# Patient Record
Sex: Male | Born: 1940 | Race: White | Hispanic: No | Smoking: Former smoker
Health system: Southern US, Community
[De-identification: ages and names within clinical notes are randomized; demographics above are authoritative.]

## PROBLEM LIST (undated history)

## (undated) DIAGNOSIS — C801 Malignant (primary) neoplasm, unspecified: Secondary | ICD-10-CM

## (undated) DIAGNOSIS — E785 Hyperlipidemia, unspecified: Secondary | ICD-10-CM

## (undated) DIAGNOSIS — S329XXA Fracture of unspecified parts of lumbosacral spine and pelvis, initial encounter for closed fracture: Secondary | ICD-10-CM

## (undated) DIAGNOSIS — I10 Essential (primary) hypertension: Secondary | ICD-10-CM

## (undated) HISTORY — PX: NO PAST SURGERIES: SHX2092

---

## 2012-06-29 ENCOUNTER — Ambulatory Visit: Admission: RE | Admit: 2012-06-29 | Payer: Self-pay | Source: Ambulatory Visit | Admitting: Radiation Oncology

## 2012-06-29 ENCOUNTER — Ambulatory Visit: Payer: Self-pay

## 2013-01-14 DIAGNOSIS — C329 Malignant neoplasm of larynx, unspecified: Secondary | ICD-10-CM | POA: Diagnosis present

## 2013-06-26 DIAGNOSIS — S329XXA Fracture of unspecified parts of lumbosacral spine and pelvis, initial encounter for closed fracture: Secondary | ICD-10-CM

## 2013-06-26 HISTORY — DX: Fracture of unspecified parts of lumbosacral spine and pelvis, initial encounter for closed fracture: S32.9XXA

## 2013-07-21 ENCOUNTER — Encounter (HOSPITAL_COMMUNITY): Payer: Self-pay | Admitting: Emergency Medicine

## 2013-07-21 ENCOUNTER — Emergency Department (HOSPITAL_COMMUNITY): Payer: No Typology Code available for payment source

## 2013-07-21 ENCOUNTER — Inpatient Hospital Stay (HOSPITAL_COMMUNITY)
Admission: EM | Admit: 2013-07-21 | Discharge: 2013-07-27 | DRG: 551 | Disposition: A | Discharge status: Home / Self Care | Payer: No Typology Code available for payment source | Attending: General Surgery | Admitting: General Surgery

## 2013-07-21 DIAGNOSIS — S329XXA Fracture of unspecified parts of lumbosacral spine and pelvis, initial encounter for closed fracture: Secondary | ICD-10-CM

## 2013-07-21 DIAGNOSIS — E785 Hyperlipidemia, unspecified: Secondary | ICD-10-CM | POA: Diagnosis present

## 2013-07-21 DIAGNOSIS — S32591A Other specified fracture of right pubis, initial encounter for closed fracture: Secondary | ICD-10-CM

## 2013-07-21 DIAGNOSIS — S3210XA Unspecified fracture of sacrum, initial encounter for closed fracture: Secondary | ICD-10-CM

## 2013-07-21 DIAGNOSIS — N138 Other obstructive and reflux uropathy: Secondary | ICD-10-CM | POA: Diagnosis present

## 2013-07-21 DIAGNOSIS — Y92410 Unspecified street and highway as the place of occurrence of the external cause: Secondary | ICD-10-CM

## 2013-07-21 DIAGNOSIS — Z8521 Personal history of malignant neoplasm of larynx: Secondary | ICD-10-CM

## 2013-07-21 DIAGNOSIS — I6529 Occlusion and stenosis of unspecified carotid artery: Secondary | ICD-10-CM | POA: Diagnosis present

## 2013-07-21 DIAGNOSIS — N501 Vascular disorders of male genital organs: Secondary | ICD-10-CM | POA: Diagnosis present

## 2013-07-21 DIAGNOSIS — S322XXA Fracture of coccyx, initial encounter for closed fracture: Secondary | ICD-10-CM

## 2013-07-21 DIAGNOSIS — N400 Enlarged prostate without lower urinary tract symptoms: Secondary | ICD-10-CM | POA: Diagnosis present

## 2013-07-21 DIAGNOSIS — Z87891 Personal history of nicotine dependence: Secondary | ICD-10-CM

## 2013-07-21 DIAGNOSIS — R339 Retention of urine, unspecified: Secondary | ICD-10-CM | POA: Diagnosis present

## 2013-07-21 DIAGNOSIS — Y998 Other external cause status: Secondary | ICD-10-CM | POA: Diagnosis not present

## 2013-07-21 DIAGNOSIS — R338 Other retention of urine: Secondary | ICD-10-CM | POA: Diagnosis present

## 2013-07-21 DIAGNOSIS — D62 Acute posthemorrhagic anemia: Secondary | ICD-10-CM | POA: Diagnosis present

## 2013-07-21 DIAGNOSIS — I10 Essential (primary) hypertension: Secondary | ICD-10-CM

## 2013-07-21 DIAGNOSIS — J189 Pneumonia, unspecified organism: Secondary | ICD-10-CM | POA: Diagnosis not present

## 2013-07-21 DIAGNOSIS — S32509A Unspecified fracture of unspecified pubis, initial encounter for closed fracture: Secondary | ICD-10-CM | POA: Diagnosis present

## 2013-07-21 DIAGNOSIS — N401 Enlarged prostate with lower urinary tract symptoms: Secondary | ICD-10-CM | POA: Diagnosis present

## 2013-07-21 DIAGNOSIS — R0902 Hypoxemia: Secondary | ICD-10-CM | POA: Diagnosis not present

## 2013-07-21 DIAGNOSIS — S32599D Other specified fracture of unspecified pubis, subsequent encounter for fracture with routine healing: Secondary | ICD-10-CM

## 2013-07-21 DIAGNOSIS — I959 Hypotension, unspecified: Secondary | ICD-10-CM | POA: Diagnosis present

## 2013-07-21 HISTORY — DX: Hyperlipidemia, unspecified: E78.5

## 2013-07-21 HISTORY — DX: Essential (primary) hypertension: I10

## 2013-07-21 HISTORY — DX: Malignant (primary) neoplasm, unspecified: C80.1

## 2013-07-21 HISTORY — DX: Fracture of unspecified parts of lumbosacral spine and pelvis, initial encounter for closed fracture: S32.9XXA

## 2013-07-21 LAB — POCT I-STAT, CHEM 8
BUN: 10 mg/dL (ref 6–23)
Chloride: 100 mEq/L (ref 96–112)
Glucose, Bld: 122 mg/dL — ABNORMAL HIGH (ref 70–99)
Potassium: 4.1 mEq/L (ref 3.5–5.1)
Sodium: 135 mEq/L (ref 135–145)
TCO2: 25 mmol/L (ref 0–100)

## 2013-07-21 LAB — CBC WITH DIFFERENTIAL/PLATELET
Basophils Absolute: 0 10*3/uL (ref 0.0–0.1)
Eosinophils Relative: 0 % (ref 0–5)
Lymphocytes Relative: 8 % — ABNORMAL LOW (ref 12–46)
Neutro Abs: 15 10*3/uL — ABNORMAL HIGH (ref 1.7–7.7)
Neutrophils Relative %: 82 % — ABNORMAL HIGH (ref 43–77)
Platelets: 145 10*3/uL — ABNORMAL LOW (ref 150–400)
RDW: 13.1 % (ref 11.5–15.5)
WBC: 18.3 10*3/uL — ABNORMAL HIGH (ref 4.0–10.5)

## 2013-07-21 LAB — URINALYSIS, ROUTINE W REFLEX MICROSCOPIC
Bilirubin Urine: NEGATIVE
Glucose, UA: NEGATIVE mg/dL
Leukocytes, UA: NEGATIVE
Protein, ur: NEGATIVE mg/dL

## 2013-07-21 MED ORDER — ONDANSETRON HCL 4 MG/2ML IJ SOLN: 4.0000 mg | Freq: Four times a day (QID) | INTRAMUSCULAR | Status: DC | PRN

## 2013-07-21 MED ORDER — OXYCODONE HCL 5 MG PO TABS
10.0000 mg | ORAL_TABLET | ORAL | Status: DC | PRN
  Administered 2013-07-21 – 2013-07-22 (×2): 10 mg via ORAL
  Filled 2013-07-21 (×2): qty 2

## 2013-07-21 MED ORDER — OXYCODONE-ACETAMINOPHEN 5-325 MG PO TABS
1.0000 | ORAL_TABLET | Freq: Once | ORAL | Status: AC
  Administered 2013-07-21: 1 via ORAL
  Filled 2013-07-21: qty 1

## 2013-07-21 MED ORDER — ONDANSETRON HCL 4 MG PO TABS
4.0000 mg | ORAL_TABLET | Freq: Four times a day (QID) | ORAL | Status: DC | PRN
  Administered 2013-07-22: 4 mg via ORAL
  Filled 2013-07-21: qty 1

## 2013-07-21 MED ORDER — OXYCODONE HCL 5 MG PO TABS: 5.0000 mg | ORAL_TABLET | ORAL | Status: DC | PRN

## 2013-07-21 MED ORDER — ACETAMINOPHEN 325 MG PO TABS: 650.0000 mg | ORAL_TABLET | ORAL | Status: DC | PRN

## 2013-07-21 MED ORDER — TAMSULOSIN HCL 0.4 MG PO CAPS
0.4000 mg | ORAL_CAPSULE | Freq: Every day | ORAL | Status: DC
Start: 2013-07-21 — End: 2013-07-22
  Administered 2013-07-22: 0.4 mg via ORAL
  Filled 2013-07-21 (×2): qty 1

## 2013-07-21 MED ORDER — KCL IN DEXTROSE-NACL 20-5-0.45 MEQ/L-%-% IV SOLN
INTRAVENOUS | Status: DC
  Administered 2013-07-21: 21:00:00 via INTRAVENOUS
  Filled 2013-07-21: qty 1000

## 2013-07-21 MED ORDER — IOHEXOL 300 MG/ML  SOLN
100.0000 mL | Freq: Once | INTRAMUSCULAR | Status: AC | PRN
  Administered 2013-07-21: 100 mL via INTRAVENOUS

## 2013-07-21 MED ORDER — SODIUM CHLORIDE 0.9 % IV BOLUS (SEPSIS)
500.0000 mL | Freq: Once | INTRAVENOUS | Status: AC
  Administered 2013-07-21: 500 mL via INTRAVENOUS

## 2013-07-21 MED ORDER — PANTOPRAZOLE SODIUM 40 MG IV SOLR
40.0000 mg | Freq: Every day | INTRAVENOUS | Status: DC
  Filled 2013-07-21: qty 40

## 2013-07-21 MED ORDER — PREGABALIN 50 MG PO CAPS
75.0000 mg | ORAL_CAPSULE | Freq: Two times a day (BID) | ORAL | Status: DC
  Administered 2013-07-22 (×2): 75 mg via ORAL
  Filled 2013-07-21 (×4): qty 1

## 2013-07-21 MED ORDER — TRAMADOL HCL 50 MG PO TABS: 50.0000 mg | ORAL_TABLET | Freq: Four times a day (QID) | ORAL | Status: DC | PRN

## 2013-07-21 MED ORDER — ENOXAPARIN SODIUM 40 MG/0.4ML ~~LOC~~ SOLN
40.0000 mg | SUBCUTANEOUS | Status: DC
  Administered 2013-07-22 – 2013-07-27 (×6): 40 mg via SUBCUTANEOUS
  Filled 2013-07-21 (×6): qty 0.4

## 2013-07-21 MED ORDER — MORPHINE SULFATE 2 MG/ML IJ SOLN: 2.0000 mg | INTRAMUSCULAR | Status: DC | PRN

## 2013-07-21 MED ORDER — PANTOPRAZOLE SODIUM 40 MG PO TBEC
40.0000 mg | DELAYED_RELEASE_TABLET | Freq: Every day | ORAL | Status: DC
  Administered 2013-07-21 – 2013-07-22 (×2): 40 mg via ORAL
  Filled 2013-07-21: qty 1
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
## 2013-07-21 NOTE — Progress Notes (Signed)
ED CM spoke with Bruce Daniel from [employer] about patient's DME insurance coverage. Insurance does not cover rolling walker. In the procecess of making patient aware. Dr. Danford Bad in room to evaluate patient has decided to admit patient due to B/P changes. Pt on call for CT scan. Notified Jason. [facility] will continue to follow for discharge. CM will will continue to follow for discharge plan.

## 2013-07-21 NOTE — Progress Notes (Signed)
Pt anxious about starting home medications. Informed him of need of list to get into epic so meds may be ordered. Son brought medication list to room, home med list updated in epic. Dr. Magnus Ivan, on call for trauma, notified of list. Orders in for lyrica and flomax starting tonight and others will be addressed in AM. Pt and wife notified of orders placed, stated understanding that the rest will be addressed tomorrow.
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
## 2013-07-22 DIAGNOSIS — D62 Acute posthemorrhagic anemia: Secondary | ICD-10-CM

## 2013-07-22 DIAGNOSIS — N400 Enlarged prostate without lower urinary tract symptoms: Secondary | ICD-10-CM | POA: Diagnosis present

## 2013-07-22 DIAGNOSIS — E785 Hyperlipidemia, unspecified: Secondary | ICD-10-CM | POA: Insufficient documentation

## 2013-07-22 DIAGNOSIS — I10 Essential (primary) hypertension: Secondary | ICD-10-CM | POA: Insufficient documentation

## 2013-07-22 LAB — URINALYSIS, ROUTINE W REFLEX MICROSCOPIC
Bilirubin Urine: NEGATIVE
Nitrite: NEGATIVE
Specific Gravity, Urine: 1.021 (ref 1.005–1.030)
Urobilinogen, UA: 0.2 mg/dL (ref 0.0–1.0)
pH: 6 (ref 5.0–8.0)

## 2013-07-22 LAB — CBC
Hemoglobin: 12.7 g/dL — ABNORMAL LOW (ref 13.0–17.0)
MCH: 30.8 pg (ref 26.0–34.0)
MCHC: 35.5 g/dL (ref 30.0–36.0)
MCV: 86.9 fL (ref 78.0–100.0)
Platelets: 124 10*3/uL — ABNORMAL LOW (ref 150–400)
RBC: 4.12 MIL/uL — ABNORMAL LOW (ref 4.22–5.81)
WBC: 11.8 10*3/uL — ABNORMAL HIGH (ref 4.0–10.5)

## 2013-07-22 LAB — BASIC METABOLIC PANEL
BUN: 10 mg/dL (ref 6–23)
CO2: 24 mEq/L (ref 19–32)
Calcium: 8.3 mg/dL — ABNORMAL LOW (ref 8.4–10.5)
Chloride: 99 mEq/L (ref 96–112)
Creatinine, Ser: 1.18 mg/dL (ref 0.50–1.35)
GFR calc Af Amer: 69 mL/min — ABNORMAL LOW (ref >90)
Glucose, Bld: 124 mg/dL — ABNORMAL HIGH (ref 70–99)

## 2013-07-22 MED ORDER — BETHANECHOL CHLORIDE 25 MG PO TABS
25.0000 mg | ORAL_TABLET | Freq: Four times a day (QID) | ORAL | Status: DC
  Administered 2013-07-22 – 2013-07-25 (×12): 25 mg via ORAL
  Filled 2013-07-22 (×16): qty 1

## 2013-07-22 MED ORDER — NAPROXEN 500 MG PO TABS
500.0000 mg | ORAL_TABLET | Freq: Two times a day (BID) | ORAL | Status: DC
  Administered 2013-07-22 – 2013-07-27 (×10): 500 mg via ORAL
  Filled 2013-07-22 (×13): qty 1

## 2013-07-22 MED ORDER — TRAMADOL HCL 50 MG PO TABS
100.0000 mg | ORAL_TABLET | Freq: Four times a day (QID) | ORAL | Status: DC
  Administered 2013-07-22 (×3): 100 mg via ORAL
  Filled 2013-07-22 (×4): qty 2

## 2013-07-22 MED ORDER — OXYCODONE HCL 5 MG PO TABS
5.0000 mg | ORAL_TABLET | ORAL | Status: DC | PRN
  Filled 2013-07-22: qty 2

## 2013-07-22 MED ORDER — ASPIRIN EC 81 MG PO TBEC
81.0000 mg | DELAYED_RELEASE_TABLET | Freq: Every day | ORAL | Status: DC
  Administered 2013-07-22 – 2013-07-27 (×6): 81 mg via ORAL
  Filled 2013-07-22 (×6): qty 1

## 2013-07-22 MED ORDER — LISINOPRIL 10 MG PO TABS
10.0000 mg | ORAL_TABLET | Freq: Two times a day (BID) | ORAL | Status: DC
  Administered 2013-07-22 – 2013-07-23 (×3): 10 mg via ORAL
  Filled 2013-07-22 (×8): qty 1

## 2013-07-22 MED ORDER — POLYETHYLENE GLYCOL 3350 17 G PO PACK
17.0000 g | PACK | Freq: Every day | ORAL | Status: DC
  Administered 2013-07-22 – 2013-07-27 (×4): 17 g via ORAL
  Filled 2013-07-22 (×6): qty 1

## 2013-07-22 MED ORDER — ATORVASTATIN CALCIUM 40 MG PO TABS
40.0000 mg | ORAL_TABLET | Freq: Every day | ORAL | Status: DC
  Administered 2013-07-22 – 2013-07-26 (×5): 40 mg via ORAL
  Filled 2013-07-22 (×6): qty 1

## 2013-07-22 MED ORDER — TAMSULOSIN HCL 0.4 MG PO CAPS
0.8000 mg | ORAL_CAPSULE | Freq: Every day | ORAL | Status: DC
  Administered 2013-07-22 – 2013-07-26 (×5): 0.8 mg via ORAL
  Filled 2013-07-22 (×7): qty 2

## 2013-07-22 MED ORDER — MORPHINE SULFATE 2 MG/ML IJ SOLN: 2.0000 mg | INTRAMUSCULAR | Status: DC | PRN

## 2013-07-22 MED ORDER — PREGABALIN 50 MG PO CAPS
75.0000 mg | ORAL_CAPSULE | Freq: Two times a day (BID) | ORAL | Status: DC
  Administered 2013-07-22 – 2013-07-27 (×6): 75 mg via ORAL
  Filled 2013-07-22 (×15): qty 1

## 2013-07-22 MED ORDER — DOCUSATE SODIUM 100 MG PO CAPS
100.0000 mg | ORAL_CAPSULE | Freq: Two times a day (BID) | ORAL | Status: DC
  Administered 2013-07-22 – 2013-07-27 (×11): 100 mg via ORAL
  Filled 2013-07-22 (×11): qty 1

## 2013-07-22 MED ORDER — MELATONIN 5 MG PO TABS: 5.0000 mg | ORAL_TABLET | Freq: Every day | ORAL | Status: DC
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
## 2013-07-23 LAB — CBC
HCT: 32.9 % — ABNORMAL LOW (ref 39.0–52.0)
Hemoglobin: 11.7 g/dL — ABNORMAL LOW (ref 13.0–17.0)
MCH: 31.1 pg (ref 26.0–34.0)
MCV: 87.5 fL (ref 78.0–100.0)
Platelets: 105 10*3/uL — ABNORMAL LOW (ref 150–400)
RBC: 3.76 MIL/uL — ABNORMAL LOW (ref 4.22–5.81)
WBC: 14.6 10*3/uL — ABNORMAL HIGH (ref 4.0–10.5)

## 2013-07-23 LAB — URINE CULTURE
Colony Count: NO GROWTH
Culture: NO GROWTH
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
## 2013-07-24 ENCOUNTER — Inpatient Hospital Stay (HOSPITAL_COMMUNITY): Payer: No Typology Code available for payment source

## 2013-07-24 DIAGNOSIS — R0902 Hypoxemia: Secondary | ICD-10-CM | POA: Diagnosis not present

## 2013-07-24 LAB — CBC
Hemoglobin: 10.2 g/dL — ABNORMAL LOW (ref 13.0–17.0)
MCH: 30.6 pg (ref 26.0–34.0)
MCV: 85.9 fL (ref 78.0–100.0)
RBC: 3.33 MIL/uL — ABNORMAL LOW (ref 4.22–5.81)
RDW: 13.1 % (ref 11.5–15.5)

## 2013-07-24 LAB — EXPECTORATED SPUTUM ASSESSMENT W GRAM STAIN, RFLX TO RESP C: Special Requests: NORMAL

## 2013-07-24 MED ORDER — TRAMADOL HCL 50 MG PO TABS: 50.0000 mg | ORAL_TABLET | Freq: Four times a day (QID) | ORAL | Status: DC | PRN

## 2013-07-24 MED ORDER — ALBUTEROL SULFATE (5 MG/ML) 0.5% IN NEBU
2.5000 mg | INHALATION_SOLUTION | Freq: Four times a day (QID) | RESPIRATORY_TRACT | Status: DC
  Administered 2013-07-24 – 2013-07-25 (×4): 2.5 mg via RESPIRATORY_TRACT
  Filled 2013-07-24 (×5): qty 0.5

## 2013-07-24 MED ORDER — DEXTROSE 5 % IV SOLN
1.0000 g | Freq: Two times a day (BID) | INTRAVENOUS | Status: DC
  Administered 2013-07-24 – 2013-07-25 (×3): 1 g via INTRAVENOUS
  Filled 2013-07-24 (×4): qty 1

## 2013-07-24 MED ORDER — IPRATROPIUM BROMIDE 0.02 % IN SOLN
0.5000 mg | Freq: Four times a day (QID) | RESPIRATORY_TRACT | Status: DC
  Administered 2013-07-24 – 2013-07-25 (×4): 0.5 mg via RESPIRATORY_TRACT
  Filled 2013-07-24 (×5): qty 2.5

## 2013-07-24 NOTE — Progress Notes (Signed)
Okay for therapy now that his sats are 98% on 4L.  Patient is confused.  Perhaps medication related.  Not hypoxemic.  I do not think that he has a pneumonia, but will keep on the antibiotics empirically until sputum culture is back.  This patient has been seen and I agree with the findings and treatment plan.  Marta Lamas. Gae Bon, MD, FACS [phone] (pager) [phone] (direct pager) Trauma Surgeon
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
## 2013-07-24 NOTE — Significant Event (Signed)
Rapid Response Event Note Lethargy & decreased sats Overview: Time Called: 0600 Arrival Time: 0603 Event Type: Respiratory  Initial Focused Assessment: On assessment pt is rhonchus with shallow breaths & weak, wet-sounding, nonproductive cough.  Sats 93% on 40%VM  Interventions: Stat PCXR Flutter Valve IS instruction  Event Summary: Name of Physician Notified: B. Janee Morn, MD at [phone]    at    Outcome: Stayed in room and stabalized     Bruce Daniel

## 2013-07-24 NOTE — Progress Notes (Signed)
PHARMACIST - PHYSICIAN ORDER COMMUNICATION  CONCERNING: P&T Medication Policy on Herbal Medications  DESCRIPTION:  This patient's order for:  melatonin  has been noted.  This product(s) is classified as an "herbal" or natural product. Due to a lack of definitive safety studies or FDA approval, nonstandard manufacturing practices, plus the potential risk of unknown drug-drug interactions while on inpatient medications, the Pharmacy and Therapeutics Committee does not permit the use of "herbal" or natural products of this type within [place].   ACTION TAKEN: The pharmacy department is unable to verify this order at this time. Please reevaluate patient's clinical condition at discharge and address if the herbal or natural product(s) should be resumed at that time.   

## 2013-07-24 NOTE — Clinical Social Work Placement (Addendum)
Clinical Social Work Department CLINICAL SOCIAL WORK PLACEMENT NOTE 07/24/2013  Patient:  Bruce Daniel, Bruce Daniel  Account Number:  [id number] Admit date:  07/21/2013  Clinical Social Worker:  Macario Golds, LCSW  Date/time:  07/24/2013 03:30 PM  Clinical Social Work is seeking post-discharge placement for this patient at the following level of care:   SKILLED NURSING   (*CSW will update this form in Epic as items are completed)   07/24/2013  Patient/family provided with [facility] Department of Clinical Social Work's list of facilities offering this level of care within the geographic area requested by the patient (or if unable, by the patient's family).  07/24/2013  Patient/family informed of their freedom to choose among providers that offer the needed level of care, that participate in Medicare, Medicaid or managed care program needed by the patient, have an available bed and are willing to accept the patient.  07/24/2013  Patient/family informed of [facility]' ownership interest in [facility], as well as of the fact that they are under no obligation to receive care at this facility.  PASARR submitted to EDS on 07/24/2013 PASARR number received from EDS on 07/24/2013  FL2 transmitted to all facilities in geographic area requested by pt/family on  07/24/2013 FL2 transmitted to all facilities within larger geographic area on   Patient informed that his/her managed care company has contracts with or will negotiate with  certain facilities, including the following:     Patient/family informed of bed offers received:  07/26/2013 Patient chooses bed at  [facility] Physician recommends and patient chooses bed at    Patient to be transferred to [facility] on  07/27/2013   Patient to be transferred to facility by   Ambulance  The following physician request were entered in Epic:   Additional Comments: 09/29 Patient girlfriend has preference to [facility] and [employer]

## 2013-07-24 NOTE — Progress Notes (Signed)
72yo male w/ lethargy and decreased sats w/ weak, wet-sounding cough. Will begin cefepime 1g IV Q12H and monitor CBC, Cx, and clinical progression.  Vernard Gambles, PharmD, BCPS 07/24/2013 7:49 AM

## 2013-07-24 NOTE — Progress Notes (Signed)
RRN paged at 6 am due pt respiratory status.  Pt's O2 sat was 71% on RA and lethargic.  Place pt on continuous pulse ox and venti mask at 50%, O2 sat went up to low 90's.  Has productive congestive cough yellowish mucus .  Encourage IS but is only able to reach up to 250.  MD on call paged. Orders received.  Will cont monitor.

## 2013-07-24 NOTE — Progress Notes (Addendum)
PT Cancellation Note  Patient Details Name: Bruce Daniel MRN: [id number] DOB: 28-Oct-1940   Cancelled Treatment:    Reason Eval/Treat Not Completed: Patient not medically ready;Medical issues which prohibited therapy. Pt with low O2 stats. Pt is being considered for SDU per last PA note. Will hold off on pt till medically stable.    Lafe Garin, OT  07/24/2013, 10:52 AM

## 2013-07-24 NOTE — Progress Notes (Signed)
PT Cancellation Note  Patient Details Name: Bruce Daniel MRN: [id number] DOB: Apr 28, 1941   Cancelled Treatment:    Reason Eval/Treat Not Completed: Patient not medically ready;Medical issues which prohibited therapy. Pt with low O2 stats. Being considered for SDU. Will hold off on pt till medically stable.   Donnamarie Poag [place], [place] [phone] 07/24/2013, 10:53 AM
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
## 2013-07-24 NOTE — Progress Notes (Signed)
Subjective:     Patient reports pain as mild.  Mostly in the left posterior thigh Pain minimal at rest.  Objective: Vital signs in last 24 hours: Temp:  [97.6 F (36.4 C)-98.4 F (36.9 C)] 98.4 F (36.9 C) (09/29 0537) Pulse Rate:  [63-90] 78 (09/29 1047) Resp:  [17-20] 20 (09/29 1047) BP: (106-116)/(47-55) 106/51 mmHg (09/29 1047) SpO2:  [91 %-97 %] 95 % (09/29 1048) FiO2 (%):  [40 %-50 %] 40 % (09/29 1047)  Intake/Output from previous day: 09/28 0701 - 09/29 0700 In: 240 [P.O.:240] Out: 425 [Urine:425] Intake/Output this shift:     Recent Labs  07/21/13 1502 07/21/13 1531 07/22/13 0355 07/23/13 0620 07/24/13 0830  HGB 14.8 15.0 12.7* 11.7* 10.2*    Recent Labs  07/23/13 0620 07/24/13 0830  WBC 14.6* 13.3*  RBC 3.76* 3.33*  HCT 32.9* 28.6*  PLT 105* 93*    Recent Labs  07/21/13 1531 07/22/13 0355  NA 135 134*  K 4.1 4.2  CL 100 99  CO2  --  24  BUN 10 10  CREATININE 1.20 1.18  GLUCOSE 122* 124*  CALCIUM  --  8.3*   No results found for this basename: LABPT, INR,  in the last 72 hours  Neurovascular intact Sensation intact distally Dorsiflexion/Plantar flexion intact  Assessment/Plan:     Up with therapy, WBAT  Aylee Littrell M 07/24/2013, 11:37 AM
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
## 2013-07-25 DIAGNOSIS — J189 Pneumonia, unspecified organism: Secondary | ICD-10-CM

## 2013-07-25 DIAGNOSIS — R338 Other retention of urine: Secondary | ICD-10-CM | POA: Diagnosis present

## 2013-07-25 DIAGNOSIS — R339 Retention of urine, unspecified: Secondary | ICD-10-CM

## 2013-07-25 LAB — CBC
Hemoglobin: 9.7 g/dL — ABNORMAL LOW (ref 13.0–17.0)
MCH: 30.6 pg (ref 26.0–34.0)
MCV: 83.6 fL (ref 78.0–100.0)
Platelets: 96 10*3/uL — ABNORMAL LOW (ref 150–400)
RBC: 3.17 MIL/uL — ABNORMAL LOW (ref 4.22–5.81)
RDW: 12.9 % (ref 11.5–15.5)
WBC: 9.4 10*3/uL (ref 4.0–10.5)

## 2013-07-25 MED ORDER — LISINOPRIL 5 MG PO TABS
5.0000 mg | ORAL_TABLET | Freq: Two times a day (BID) | ORAL | Status: DC
  Administered 2013-07-25 – 2013-07-26 (×4): 5 mg via ORAL
  Filled 2013-07-25 (×6): qty 1

## 2013-07-25 MED ORDER — ALBUTEROL SULFATE (5 MG/ML) 0.5% IN NEBU
2.5000 mg | INHALATION_SOLUTION | Freq: Two times a day (BID) | RESPIRATORY_TRACT | Status: DC
  Administered 2013-07-25 – 2013-07-27 (×2): 2.5 mg via RESPIRATORY_TRACT
  Filled 2013-07-25 (×4): qty 0.5

## 2013-07-25 MED ORDER — LEVOFLOXACIN 500 MG PO TABS
500.0000 mg | ORAL_TABLET | Freq: Every day | ORAL | Status: DC
  Administered 2013-07-25 – 2013-07-27 (×3): 500 mg via ORAL
  Filled 2013-07-25 (×4): qty 1

## 2013-07-25 MED ORDER — IPRATROPIUM BROMIDE 0.02 % IN SOLN
0.5000 mg | Freq: Two times a day (BID) | RESPIRATORY_TRACT | Status: DC
  Administered 2013-07-25 – 2013-07-27 (×2): 0.5 mg via RESPIRATORY_TRACT
  Filled 2013-07-25 (×4): qty 2.5
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
## 2013-07-25 NOTE — Progress Notes (Signed)
Pt's foley was DC at 07/24/13 at 1720. Pt tried multiple unsuccessful times to void. MD on call paged. Foley inserted per order. Pt tolerated well, 400 ml clear urine drained , will monitor.
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
## 2013-07-25 NOTE — Progress Notes (Signed)
Concerned about urinary retention. Plan for urology consult. Continue therapies. I also spoke to his wife. Patient examined and I agree with the assessment and plan  Violeta Gelinas, MD, MPH, FACS Pager: [phone]  07/25/2013 9:58 AM

## 2013-07-26 ENCOUNTER — Encounter (HOSPITAL_COMMUNITY): Payer: Self-pay | Admitting: General Practice

## 2013-07-26 LAB — CBC
HCT: 27.8 % — ABNORMAL LOW (ref 39.0–52.0)
Hemoglobin: 10.1 g/dL — ABNORMAL LOW (ref 13.0–17.0)
RBC: 3.27 MIL/uL — ABNORMAL LOW (ref 4.22–5.81)
RDW: 13.3 % (ref 11.5–15.5)
WBC: 10.1 10*3/uL (ref 4.0–10.5)

## 2013-07-26 LAB — CULTURE, RESPIRATORY W GRAM STAIN

## 2013-07-26 LAB — CULTURE, RESPIRATORY

## 2013-07-26 MED ORDER — ENSURE COMPLETE PO LIQD
237.0000 mL | Freq: Two times a day (BID) | ORAL | Status: DC
  Administered 2013-07-26 (×2): 237 mL via ORAL
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
## 2013-07-26 NOTE — Progress Notes (Signed)
Subjective:     Patient reports pain as mild.   Progressing slowly with PT.  Pain controlled.  Objective: Vital signs in last 24 hours: Temp:  [97.6 F (36.4 C)-98.5 F (36.9 C)] 97.6 F (36.4 C) (10/01 1300) Pulse Rate:  [85-102] 102 (10/01 1300) Resp:  [16-18] 17 (10/01 1300) BP: (120-133)/(50-56) 120/50 mmHg (10/01 1300) SpO2:  [93 %-96 %] 96 % (10/01 1300)  Intake/Output from previous day: 09/30 0701 - 10/01 0700 In: 1400 [P.O.:1400] Out: 3900 [Urine:3900] Intake/Output this shift: Total I/O In: 560 [P.O.:560] Out: 450 [Urine:450]   Recent Labs  07/24/13 0830 07/25/13 0550 07/26/13 0540  HGB 10.2* 9.7* 10.1*    Recent Labs  07/25/13 0550 07/26/13 0540  WBC 9.4 10.1  RBC 3.17* 3.27*  HCT 26.5* 27.8*  PLT 96* 139*   No results found for this basename: NA, K, CL, CO2, BUN, CREATININE, GLUCOSE, CALCIUM,  in the last 72 hours No results found for this basename: LABPT, INR,  in the last 72 hours  Neurovascular intact Sensation intact distally Dorsiflexion/Plantar flexion intact  Assessment/Plan:     Discharge to SNF.  Stable from ortho standpoint for discharge when bed available.  Bruce Daniel M 07/26/2013, 4:01 PM

## 2013-07-26 NOTE — Progress Notes (Signed)
UR completed.  Tanda Morrissey, RN BSN MHA CCM Trauma/Neuro ICU Case Manager [phone]  
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
## 2013-07-26 NOTE — Progress Notes (Signed)
Patient awaiting placement ans wants [facility].  Will check with Child psychotherapist.  This patient has been seen and I agree with the findings and treatment plan.  Marta Lamas. Gae Bon, MD, FACS [phone] (pager) [phone] (direct pager) Trauma Surgeon

## 2013-07-27 ENCOUNTER — Non-Acute Institutional Stay (SKILLED_NURSING_FACILITY): Payer: Medicare Other | Admitting: Internal Medicine

## 2013-07-27 ENCOUNTER — Encounter: Payer: Self-pay | Admitting: Internal Medicine

## 2013-07-27 DIAGNOSIS — D62 Acute posthemorrhagic anemia: Secondary | ICD-10-CM

## 2013-07-27 DIAGNOSIS — R338 Other retention of urine: Secondary | ICD-10-CM

## 2013-07-27 DIAGNOSIS — R0902 Hypoxemia: Secondary | ICD-10-CM

## 2013-07-27 DIAGNOSIS — S3210XD Unspecified fracture of sacrum, subsequent encounter for fracture with routine healing: Secondary | ICD-10-CM

## 2013-07-27 DIAGNOSIS — E785 Hyperlipidemia, unspecified: Secondary | ICD-10-CM

## 2013-07-27 DIAGNOSIS — IMO0001 Reserved for inherently not codable concepts without codable children: Secondary | ICD-10-CM

## 2013-07-27 DIAGNOSIS — S32591D Other specified fracture of right pubis, subsequent encounter for fracture with routine healing: Secondary | ICD-10-CM

## 2013-07-27 DIAGNOSIS — I10 Essential (primary) hypertension: Secondary | ICD-10-CM

## 2013-07-27 MED ORDER — LEVOFLOXACIN 500 MG PO TABS: 500.0000 mg | ORAL_TABLET | Freq: Every day | ORAL | Status: AC

## 2013-07-27 MED ORDER — ENSURE COMPLETE PO LIQD: 237.0000 mL | Freq: Two times a day (BID) | ORAL | Status: DC

## 2013-07-27 MED ORDER — IPRATROPIUM BROMIDE 0.02 % IN SOLN: 0.5000 mg | Freq: Two times a day (BID) | RESPIRATORY_TRACT | Status: DC

## 2013-07-27 MED ORDER — POLYETHYLENE GLYCOL 3350 17 G PO PACK: 17.0000 g | PACK | Freq: Every day | ORAL | Status: DC

## 2013-07-27 MED ORDER — TRAMADOL HCL 50 MG PO TABS: 50.0000 mg | ORAL_TABLET | Freq: Four times a day (QID) | ORAL | Status: DC | PRN

## 2013-07-27 MED ORDER — NAPROXEN 500 MG PO TABS: 500.0000 mg | ORAL_TABLET | Freq: Two times a day (BID) | ORAL | Status: AC

## 2013-07-27 MED ORDER — ALBUTEROL SULFATE (5 MG/ML) 0.5% IN NEBU: 2.5000 mg | INHALATION_SOLUTION | Freq: Two times a day (BID) | RESPIRATORY_TRACT | Status: DC
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
## 2013-07-27 NOTE — Assessment & Plan Note (Signed)
Continue Lipitor 40 mg
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
## 2013-07-27 NOTE — Assessment & Plan Note (Signed)
On lisinopril 10 mg BID
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
## 2013-07-27 NOTE — Progress Notes (Signed)
D/C foley order -patient refuses. Risk of prolonged foley use explained to patient and wife. Continue to follow at SNF per MD V.O. if patient refuses.
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
## 2013-07-27 NOTE — Progress Notes (Signed)
Agree with updated frequency per PTA. [place], [place], [place] [phone]

## 2013-07-27 NOTE — Assessment & Plan Note (Signed)
Did not require blood tx;will recheck on Monday 10/6

## 2013-07-27 NOTE — Progress Notes (Signed)
Patient now requests foley DC prior to transport to SNF . Requests condom cath when voids at SNF -reported to receiving RN.
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
## 2013-07-27 NOTE — Clinical Social Work Note (Signed)
Clinical Social Worker facilitated patient discharge including contacting patient family and facility to confirm patient discharge plans.  Clinical information faxed to facility and family agreeable with plan.  CSW arranged ambulance transport via [facility] to [facility].  RN to call report prior to discharge.  Clinical Social Worker will sign off for now as social work intervention is no longer needed. Please consult us again if new need arises.  Jesse Johnni Wunschel, LCSW [phone] 

## 2013-07-27 NOTE — Discharge Summary (Signed)
Bruce Barefield, MD, MPH, FACS Pager: [phone]  

## 2013-07-27 NOTE — Progress Notes (Signed)
Report called to receiving RN.

## 2013-07-27 NOTE — Progress Notes (Signed)
Will D/C foley. Awaiting SNF placement. Patient examined and I agree with the assessment and plan  Violeta Gelinas, MD, MPH, FACS Pager: [phone]  07/27/2013 10:18 AM
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
## 2013-08-22 ENCOUNTER — Encounter: Payer: Self-pay | Admitting: Internal Medicine

## 2013-08-22 ENCOUNTER — Non-Acute Institutional Stay (SKILLED_NURSING_FACILITY): Payer: Medicare Other | Admitting: Internal Medicine

## 2013-08-22 DIAGNOSIS — S3210XD Unspecified fracture of sacrum, subsequent encounter for fracture with routine healing: Secondary | ICD-10-CM

## 2013-08-22 DIAGNOSIS — N400 Enlarged prostate without lower urinary tract symptoms: Secondary | ICD-10-CM

## 2013-08-22 DIAGNOSIS — E785 Hyperlipidemia, unspecified: Secondary | ICD-10-CM

## 2013-08-22 DIAGNOSIS — IMO0001 Reserved for inherently not codable concepts without codable children: Secondary | ICD-10-CM

## 2013-08-22 DIAGNOSIS — S32599D Other specified fracture of unspecified pubis, subsequent encounter for fracture with routine healing: Secondary | ICD-10-CM

## 2013-08-22 DIAGNOSIS — S72009D Fracture of unspecified part of neck of unspecified femur, subsequent encounter for closed fracture with routine healing: Secondary | ICD-10-CM
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
## 2013-08-26 DIAGNOSIS — I10 Essential (primary) hypertension: Secondary | ICD-10-CM

## 2013-08-26 DIAGNOSIS — IMO0001 Reserved for inherently not codable concepts without codable children: Secondary | ICD-10-CM

## 2013-08-26 DIAGNOSIS — IMO0002 Reserved for concepts with insufficient information to code with codable children: Secondary | ICD-10-CM
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
## 2019-06-14 ENCOUNTER — Other Ambulatory Visit: Payer: Self-pay

## 2019-06-14 DIAGNOSIS — Z20822 Contact with and (suspected) exposure to covid-19: Secondary | ICD-10-CM

## 2019-06-15 LAB — NOVEL CORONAVIRUS, NAA: SARS-CoV-2, NAA: NOT DETECTED

## 2024-03-15 DIAGNOSIS — I350 Nonrheumatic aortic (valve) stenosis: Secondary | ICD-10-CM | POA: Insufficient documentation

## 2024-08-30 NOTE — ED Provider Notes (Signed)
------------------------------------------------------------------------------- °  Attestation signed by Greig Loa Buttery, DO at 09/06/2024 12:57 AM ________________________________________________________________________ Shared service with APP.  I have personally seen and examined the patient, providing direct face to face care. I performed a substantive portion of the medical decision making for the patient encounter, as documented by the APP.    My interpretation of this patient's presentation was consistent with the APP. Patient presented due to concerns for social concerns due to inability to care for self/unsafe living conditions. Pt was determined to not have capacity and was unsafe returning home alone.  Patient's presentation is most consistent with exacerbation of chronic illness.    Amy Diane Lowther, DO  -------------------------------------------------------------------------------  Medical Decision Making: Care of patient assumed from Dr. Ethridge at 0700.  Agree with history and physical exam as documented - please see their note for details. Briefly, this is a  83 y.o. male with PMHx of undergoing laryngeal cx radiation treatments(stage II HP SqCC), s/p gtube placement 08/17/24 d/t severe odynophagia, prior history of Sqcc of the glottis s/p RT 63 Gy 28 fx (2013), severe aortic stenosis, CAD, HTN, BPH who currently resides at [facility]) s/p his gtube placement 08/17/24 d/t severe odynophagia. He was sent to the ED from his oncologist because he signed out AMA from his SNF and there are concerns about his capacity and his ability to care for himself. He is a social hold pending safe disposition.  Had extensive conversation with the patient. Unfortunately, I do not believe that he has capacity to make his own medical decisions. He did not demonstrate understanding of his medical problems. He told me that he was not aware that he has cancer. When I asked him what the  purpose of his radiation is for he said for my activity. When we discussed his difficulty with using his feeding tube that was documented by the nutritionist consult he told me I don't remember that and then told me that he dose not need a feeding tube because he can eat just fine. When I discussed his trouble swallowing due to his cancer he responded, well my throat is sore, I will give you that. Patient was not able to get out of bed without significant assistance from myself and a medical student and could not ambulate on his own. Social work has spoken with family and they feel that patient is best served in a facility and is not properly caring for himself or safe at home alone. Patient cannot explain to me what the forseeable consequences of accepting or refusing proposed treatments would be therefore my overall impression is that this patient does not have capacity. I discussed my concerns with the patient and that although I understand that patient would like to go back home by himself, we will need to send him back to his SNF. Family was updated by social work and is agreeable with this plan. Patient discharged to SNF with stable vital signs and with 4 days of feeding tube supplies to last until delivery of more supplies can be made to SNF.  Clinical Impression: 1. Unable to function independently Active  2. Laryngeal cancer [facility])     Disposition: ED Disposition     ED Disposition  Discharge   Condition  Stable   Comment  08/30/2024

## 2024-10-02 NOTE — Progress Notes (Signed)
 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 Lauralee Medical Oncologist: Dr. Achilles Radiation Oncologist: Dr. Vinita, previously Dr. Cheree   Laryngeal cancer    (CMD)  07/11/2012 - 08/17/2012 Radiation   63 Gy in 28 fractions to larynx   07/18/2024 - 09/07/2024 Radiation   IMRT: Head and neck   Treatment Period Technique Fraction Dose Fractions Total Dose  Course 2 07/18/2024-09/07/2024  (days elapsed: 51)        Hypopharynx retx 07/18/2024-09/07/2024 VMAT 200 / 200 cGy 33 / 33 6,600 / 6,600 cGy     Squamous cell cancer of hypopharynx    (CMD)  12/14/2023 Imaging   CT Neck - Nonspecific mild soft tissue thickening in the inferior left piriform sinus and left post-cricoid region, without a discrete mass    11/2023 Recurrence   3-4 months of sore throat and L otalgia, 10-15 lb weight loss CT neck: nonspecific mild soft tissue thickening L piriform sinus, L post-cricoid    01/2024 Procedure   FFL at [facility]: ulcerative mass left/midline postcricoid 2-3 cm; pyriform sinuses appear clear    02/28/2024 Imaging   PET/CT: ill-defined soft tissue thickening with focal FDG uptake in the left piriform sinus; nonspecific FDG uptake at the L BOT and L NP, pharyngeal recess uptake; small right level 2B lymph node with mild FDG uptake is favored reactive    03/01/2024 -  Other   FFL: vocal folds were mildly edematous but smooth and without mucosal lesion. Vocal fold mobility was intact and symmetric. There is a vague area of mucosal abnormality  in the left postcricoid area, not obvious    06/09/2024 Biopsy   Left HP: squamous cell carcinoma, moderately differentiated DL findings: firm ulcerative lesion along the L HP/postcricoid region   07/18/2024 - 09/07/2024 Radiation   IMRT: Head and neck   Treatment Period  Technique Fraction Dose Fractions Total Dose  Course 2 07/18/2024-09/07/2024  (days elapsed: 51)        Hypopharynx retx 07/18/2024-09/07/2024 VMAT 200 / 200 cGy 33 / 33 6,600 / 6,600 cGy       Interval: 1 month since last visit, 1 month since completion of therapy  Updated PMH:  Medical History[1] No in-person visits or hospital admissions since his final fraction of radiation therapy No ED visits or hospitalizations.  HPI: KEYSHAWN HELLWIG is a 83 y.o. male who returns today for regularly scheduled follow-up. His course of radiation therapy was complicated by an admission to hospital from 08/16/2024 to 08/23/2024 for failure to thrive and malnutrition. He was discharged to a SNF and finished his radiation therapy with transportation to our department to finish off his radiation therapy. On 11/4, he arrived to radiation oncology and refused to be returned to the SNF. He was taken to the ED, who subsequently had him transported back t the SNF where the patient remained until a few days ago. The patient's son travelled from [place] and took the patient home for 24 hours while making arrangements for home nursing care. Unfortunately, the patient is still feeding tube dependent and they were unable to hire nursing care who were somfortable providing VT nutrition. The patient has refused to return to the SNF and he continues to refuse to return to that level of care.  HN ROS CTCAE v4 Grade  Pain: reports a  constant 3-4/10 pain which is not affected by PO intake. He is not using any analgesic. 2: Moderate pain and analgesics indicated; altered oral intake; limiting instrumental ADL  Taste: denies taste changes but he has not been taking nutrition PO in any significant quantities. 1: Altered taste but no change in diet (diet is affected by pain and dysphagia more than taste)  Saliva: grades as mild, reports moderately thick saliva, spits out his oral secretions constantly as he is unable to swallow them.   3: Inability to adequately aliment orally; tube feeding or TPN indicated; unstimulated saliva <0.1 ml/min  Voice: hoarse quiet whisper 2: Moderate or persistent voice changes; may require occasional repetition but understandable on telephone; medical evaluation indicated  Swallowing: cannot swallow his own saliva, tested with a sip of water and immediately started coughing, Pt grades as moderate, but per his son when at home he has been trying to eat solid food 3: Severely altered eating/swallowing; tube feeding, TPN or hospitalization indicated  GTube: Yes, Mic-Key feeding tube site healthy. Pt was able to disconnect the extender on his own, but he states he is unable to use the feeding tube himself as it is too complicated.  FOIS: 2: Tube dependent with minimal/inconsistent oral intake  Dentistry: has not followed up yet since finishing therapy Fluoride use: no  Misc: poor cognitive state. Pt's PCP told him he was unable to drive yesterday.    Head and Neck PRO CTCAE Forms. Pt was assisted by his son to fill out the CTCAE PRO form has he was unable to do this himself. During the visit, patient would change the provided scores multiple times.  Date 10/06/2024  Pain Often 2  Pain Severity 2  Pain Interference 0  Dry Mouth 1  Taste Problems 0  Difficulty Swallowing 1  FOIS 3  Voice Change 0  Hoarse Voice 1  Fatigue Severity 1  Fatigue Interference 0    Physical Examination:  Vitals:   10/06/24 1239  BP: 131/62  BP Location: Right arm  Patient Position: Sitting  Pulse: 77  Resp: 17  Temp: 97.7 F (36.5 C)  TempSrc: Temporal  SpO2: 100%  Weight: 46.7 kg (102 lb 14.4 oz)  BMI 16.61 kg/m  Wt Readings from Last 6 Encounters:  10/06/24 46.7 kg (102 lb 14.4 oz)  09/07/24 46.4 kg (102 lb 3.2 oz)  08/28/24 48.1 kg (106 lb)  08/17/24 44 kg (97 lb)  08/16/24 43 kg (94 lb 12.8 oz)  08/14/24 44.7 kg (98 lb 9.6 oz)     ECOG: 3 - Capable of only limited selfcare; confined to bed or chair  > 50 percent of waking hours KPS (40-30%)   Abdomen: G-Tube present  Head and Neck Exam: ORAL CAVITY: No mucositis, reduced tongue mobility and lateral tongue pseudomembraneous. No thrush or lesions noted.  OROPHARYNX: no lesions or masses noted, no thrush or mucositis noted NECK: no lymphadenopathy noted General: cachectic MSK: poor balance, using a wheeled walker to stay upright, moving slow and transferring with assistance Neuro: Minicog score of 0/5. Pt was not oriented to person, place, time, or situation consistently during the visit.   MiniCog:   Labs: TSH  Date Value Ref Range Status  07/28/2024 0.586 0.450 - 5.330 uIU/mL Final   Radiology:  No recent relevant imaging to review.  Assessment:   1.  JEURY MCNAB is a 83 y.o. male with a history of Stage II cT2N0M0 of the glottic larynx (2013) treated with radiation therapy, then  stage II cT2N0M0 of the hypopharynx (2025) which was treated with 66Gy definitive radiation VMAT in 33 fractions. On assessment today, there is no clear evidence of persistent disease though the patient continues to have odynphagia and dysphagia. 2.  Elizah Mierzwa is recovering very slowly from radiation therapy, and this is complicated by his malnutrition. The patient was more concerned that I reinstate his driver's license, and on quesitoning he could not recall what building he was in or what appointment he was seeing me for. While he stated my face was familiar, he did not know who I was nor my role. 3. The patient's son has to return to [place] for work, and states he cannot force his father to return to the SNF. However, we were in agreement that his father is unsafe at home without care. The arrangements per the son are currently for approximately 24h/wk care. The patietn has not been following any diet restrictions at home (tried to eat solid food) and the patient was unable to demonstrate how to use his feeding tube. The patient is unwilling to  stay at a SNF nor allow any care, but he is not oriented sufficiently to participate in his care.   Plan:  1.  RTC 2 months with Dr. Vinita and post-op CT Soft Tissue Neck W Contrast and PET/CT Skull Base to Mid-Thigh 2. I communicated to the patient and his son that in my opinion, he is best served by beng returned to the SNF.   Scheduled Future Appointments       Provider Department Dept Phone Center   10/06/2024  1:00 PM Norleen Misty [facility] - Radiation Oncology [phone] [facility] Comp Can       Norleen Sprawls DNP AGNP-C RN [facility] Radiation Oncology        [1] Past Medical History: Diagnosis Date   Aortic stenosis, mild    Cancer    (CMD)    laryngeal    Carotid artery disease    Hyperlipidemia    Hypertension    Laryngeal cancer    (CMD) 05/27/2012

## 2024-10-20 ENCOUNTER — Inpatient Hospital Stay (HOSPITAL_COMMUNITY)
Admission: EM | Admit: 2024-10-20 | Discharge: 2024-10-26 | DRG: 871 | Disposition: A | Discharge status: Hospice | Attending: Internal Medicine | Admitting: Internal Medicine

## 2024-10-20 ENCOUNTER — Other Ambulatory Visit: Payer: Self-pay

## 2024-10-20 ENCOUNTER — Emergency Department (HOSPITAL_COMMUNITY)

## 2024-10-20 DIAGNOSIS — F0394 Unspecified dementia, unspecified severity, with anxiety: Secondary | ICD-10-CM | POA: Diagnosis present

## 2024-10-20 DIAGNOSIS — E871 Hypo-osmolality and hyponatremia: Secondary | ICD-10-CM | POA: Diagnosis present

## 2024-10-20 DIAGNOSIS — E860 Dehydration: Secondary | ICD-10-CM | POA: Diagnosis present

## 2024-10-20 DIAGNOSIS — Z711 Person with feared health complaint in whom no diagnosis is made: Secondary | ICD-10-CM | POA: Diagnosis not present

## 2024-10-20 DIAGNOSIS — Z79899 Other long term (current) drug therapy: Secondary | ICD-10-CM

## 2024-10-20 DIAGNOSIS — Z888 Allergy status to other drugs, medicaments and biological substances status: Secondary | ICD-10-CM

## 2024-10-20 DIAGNOSIS — E785 Hyperlipidemia, unspecified: Secondary | ICD-10-CM | POA: Diagnosis present

## 2024-10-20 DIAGNOSIS — A4189 Other specified sepsis: Principal | ICD-10-CM | POA: Diagnosis present

## 2024-10-20 DIAGNOSIS — I350 Nonrheumatic aortic (valve) stenosis: Secondary | ICD-10-CM | POA: Diagnosis present

## 2024-10-20 DIAGNOSIS — A419 Sepsis, unspecified organism: Secondary | ICD-10-CM

## 2024-10-20 DIAGNOSIS — Z7189 Other specified counseling: Secondary | ICD-10-CM | POA: Diagnosis not present

## 2024-10-20 DIAGNOSIS — C329 Malignant neoplasm of larynx, unspecified: Secondary | ICD-10-CM | POA: Diagnosis present

## 2024-10-20 DIAGNOSIS — R1310 Dysphagia, unspecified: Secondary | ICD-10-CM | POA: Diagnosis present

## 2024-10-20 DIAGNOSIS — Z66 Do not resuscitate: Secondary | ICD-10-CM | POA: Diagnosis present

## 2024-10-20 DIAGNOSIS — Z923 Personal history of irradiation: Secondary | ICD-10-CM

## 2024-10-20 DIAGNOSIS — J1282 Pneumonia due to coronavirus disease 2019: Secondary | ICD-10-CM | POA: Diagnosis present

## 2024-10-20 DIAGNOSIS — Z882 Allergy status to sulfonamides status: Secondary | ICD-10-CM

## 2024-10-20 DIAGNOSIS — Z952 Presence of prosthetic heart valve: Secondary | ICD-10-CM

## 2024-10-20 DIAGNOSIS — N401 Enlarged prostate with lower urinary tract symptoms: Secondary | ICD-10-CM

## 2024-10-20 DIAGNOSIS — D696 Thrombocytopenia, unspecified: Secondary | ICD-10-CM | POA: Diagnosis present

## 2024-10-20 DIAGNOSIS — N179 Acute kidney failure, unspecified: Secondary | ICD-10-CM | POA: Diagnosis present

## 2024-10-20 DIAGNOSIS — E8809 Other disorders of plasma-protein metabolism, not elsewhere classified: Secondary | ICD-10-CM | POA: Diagnosis present

## 2024-10-20 DIAGNOSIS — R6520 Severe sepsis without septic shock: Secondary | ICD-10-CM | POA: Diagnosis present

## 2024-10-20 DIAGNOSIS — N400 Enlarged prostate without lower urinary tract symptoms: Secondary | ICD-10-CM | POA: Diagnosis present

## 2024-10-20 DIAGNOSIS — I10 Essential (primary) hypertension: Secondary | ICD-10-CM | POA: Diagnosis present

## 2024-10-20 DIAGNOSIS — Z515 Encounter for palliative care: Secondary | ICD-10-CM

## 2024-10-20 DIAGNOSIS — Z8572 Personal history of non-Hodgkin lymphomas: Secondary | ICD-10-CM

## 2024-10-20 DIAGNOSIS — R0602 Shortness of breath: Secondary | ICD-10-CM

## 2024-10-20 DIAGNOSIS — M6250 Muscle wasting and atrophy, not elsewhere classified, unspecified site: Secondary | ICD-10-CM | POA: Diagnosis present

## 2024-10-20 DIAGNOSIS — E870 Hyperosmolality and hypernatremia: Secondary | ICD-10-CM | POA: Diagnosis present

## 2024-10-20 DIAGNOSIS — L89151 Pressure ulcer of sacral region, stage 1: Secondary | ICD-10-CM | POA: Diagnosis present

## 2024-10-20 DIAGNOSIS — E876 Hypokalemia: Secondary | ICD-10-CM | POA: Diagnosis present

## 2024-10-20 DIAGNOSIS — Z7982 Long term (current) use of aspirin: Secondary | ICD-10-CM | POA: Diagnosis not present

## 2024-10-20 DIAGNOSIS — L8990 Pressure ulcer of unspecified site, unspecified stage: Secondary | ICD-10-CM | POA: Diagnosis present

## 2024-10-20 DIAGNOSIS — W1811XA Fall from or off toilet without subsequent striking against object, initial encounter: Secondary | ICD-10-CM | POA: Diagnosis present

## 2024-10-20 DIAGNOSIS — U071 COVID-19: Secondary | ICD-10-CM | POA: Diagnosis present

## 2024-10-20 DIAGNOSIS — D649 Anemia, unspecified: Secondary | ICD-10-CM | POA: Diagnosis present

## 2024-10-20 DIAGNOSIS — R627 Adult failure to thrive: Secondary | ICD-10-CM | POA: Diagnosis present

## 2024-10-20 DIAGNOSIS — D630 Anemia in neoplastic disease: Secondary | ICD-10-CM | POA: Diagnosis present

## 2024-10-20 DIAGNOSIS — Z87891 Personal history of nicotine dependence: Secondary | ICD-10-CM

## 2024-10-20 LAB — COMPREHENSIVE METABOLIC PANEL WITH GFR
ALT: 33 U/L (ref 0–44)
AST: 81 U/L — ABNORMAL HIGH (ref 15–41)
Albumin: 4 g/dL (ref 3.5–5.0)
Alkaline Phosphatase: 89 U/L (ref 38–126)
Anion gap: 24 — ABNORMAL HIGH (ref 5–15)
BUN: 110 mg/dL — ABNORMAL HIGH (ref 8–23)
CO2: 22 mmol/L (ref 22–32)
Calcium: 10.1 mg/dL (ref 8.9–10.3)
Chloride: 118 mmol/L — ABNORMAL HIGH (ref 98–111)
Creatinine, Ser: 3.33 mg/dL — ABNORMAL HIGH (ref 0.61–1.24)
GFR, Estimated: 18 mL/min — ABNORMAL LOW
Glucose, Bld: 128 mg/dL — ABNORMAL HIGH (ref 70–99)
Potassium: 4.4 mmol/L (ref 3.5–5.1)
Sodium: 164 mmol/L (ref 135–145)
Total Bilirubin: 1 mg/dL (ref 0.0–1.2)
Total Protein: 8.7 g/dL — ABNORMAL HIGH (ref 6.5–8.1)

## 2024-10-20 LAB — RESP PANEL BY RT-PCR (RSV, FLU A&B, COVID)  RVPGX2
Influenza A by PCR: NEGATIVE
Influenza B by PCR: NEGATIVE
Resp Syncytial Virus by PCR: NEGATIVE
SARS Coronavirus 2 by RT PCR: POSITIVE — AB

## 2024-10-20 LAB — CBC WITH DIFFERENTIAL/PLATELET
Abs Immature Granulocytes: 0.16 K/uL — ABNORMAL HIGH (ref 0.00–0.07)
Basophils Absolute: 0 K/uL (ref 0.0–0.1)
Basophils Relative: 0 %
Eosinophils Absolute: 0 K/uL (ref 0.0–0.5)
Eosinophils Relative: 0 %
HCT: 45 % (ref 39.0–52.0)
Hemoglobin: 13.8 g/dL (ref 13.0–17.0)
Immature Granulocytes: 1 %
Lymphocytes Relative: 2 %
Lymphs Abs: 0.3 K/uL — ABNORMAL LOW (ref 0.7–4.0)
MCH: 30.7 pg (ref 26.0–34.0)
MCHC: 30.7 g/dL (ref 30.0–36.0)
MCV: 100.2 fL — ABNORMAL HIGH (ref 80.0–100.0)
Monocytes Absolute: 1.4 K/uL — ABNORMAL HIGH (ref 0.1–1.0)
Monocytes Relative: 7 %
Neutro Abs: 17.8 K/uL — ABNORMAL HIGH (ref 1.7–7.7)
Neutrophils Relative %: 90 %
Platelets: 140 K/uL — ABNORMAL LOW (ref 150–400)
RBC: 4.49 MIL/uL (ref 4.22–5.81)
RDW: 17.1 % — ABNORMAL HIGH (ref 11.5–15.5)
WBC: 19.7 K/uL — ABNORMAL HIGH (ref 4.0–10.5)
nRBC: 0 % (ref 0.0–0.2)

## 2024-10-20 LAB — I-STAT CG4 LACTIC ACID, ED
Lactic Acid, Venous: 3.5 mmol/L (ref 0.5–1.9)
Lactic Acid, Venous: 1.1 mmol/L (ref 0.5–1.9)

## 2024-10-20 MED ORDER — SODIUM CHLORIDE 0.9 % IV SOLN
100.0000 mg | Freq: Every day | INTRAVENOUS | Status: AC
  Administered 2024-10-21 – 2024-10-22 (×2): 100 mg via INTRAVENOUS
  Filled 2024-10-20 (×3): qty 20

## 2024-10-20 MED ORDER — HEPARIN SODIUM (PORCINE) 5000 UNIT/ML IJ SOLN
5000.0000 [IU] | Freq: Three times a day (TID) | INTRAMUSCULAR | Status: DC
  Administered 2024-10-21 (×2): 5000 [IU] via SUBCUTANEOUS
  Filled 2024-10-20 (×2): qty 1

## 2024-10-20 MED ORDER — LACTATED RINGERS IV BOLUS (SEPSIS)
1000.0000 mL | Freq: Once | INTRAVENOUS | Status: AC
  Administered 2024-10-20: 1000 mL via INTRAVENOUS

## 2024-10-20 MED ORDER — ONDANSETRON HCL 4 MG PO TABS: 4.0000 mg | ORAL_TABLET | Freq: Four times a day (QID) | ORAL | Status: DC | PRN

## 2024-10-20 MED ORDER — VANCOMYCIN HCL IN DEXTROSE 1-5 GM/200ML-% IV SOLN
1000.0000 mg | Freq: Once | INTRAVENOUS | Status: AC
  Administered 2024-10-20: 1000 mg via INTRAVENOUS
  Filled 2024-10-20: qty 200

## 2024-10-20 MED ORDER — GLYCERIN (LAXATIVE) 2 G RE SUPP: 1.0000 | Freq: Every day | RECTAL | Status: DC | PRN

## 2024-10-20 MED ORDER — SODIUM CHLORIDE 0.9 % IV SOLN
2.0000 g | Freq: Once | INTRAVENOUS | Status: AC
  Administered 2024-10-20: 2 g via INTRAVENOUS
  Filled 2024-10-20: qty 12.5

## 2024-10-20 MED ORDER — SODIUM CHLORIDE 0.9 % IV BOLUS
1000.0000 mL | Freq: Once | INTRAVENOUS | Status: AC
  Administered 2024-10-20: 1000 mL via INTRAVENOUS

## 2024-10-20 MED ORDER — ONDANSETRON HCL 4 MG/2ML IJ SOLN: 4.0000 mg | Freq: Four times a day (QID) | INTRAMUSCULAR | Status: DC | PRN

## 2024-10-20 MED ORDER — SODIUM CHLORIDE 0.9 % IV SOLN
200.0000 mg | Freq: Once | INTRAVENOUS | Status: AC
  Administered 2024-10-21: 200 mg via INTRAVENOUS
  Filled 2024-10-20: qty 40

## 2024-10-20 MED ORDER — SODIUM CHLORIDE 0.9% FLUSH
3.0000 mL | Freq: Two times a day (BID) | INTRAVENOUS | Status: DC
  Administered 2024-10-20 – 2024-10-25 (×4): 3 mL via INTRAVENOUS

## 2024-10-20 MED ORDER — LACTATED RINGERS IV SOLN: INTRAVENOUS | Status: AC

## 2024-10-20 NOTE — ED Notes (Signed)
 Report given to [facility]
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
## 2024-10-20 NOTE — Progress Notes (Signed)
 Notified bedside nurse of need to draw repeat lactic acid.

## 2024-10-20 NOTE — ED Triage Notes (Signed)
 Pt from home hx of throat ca - not undergoing tx d/t weightloss , pt slide from [facility] today and wife could not get him up. Pt with failure to thrive, dementia, and EMS reports wife states tube feeding has pulled out appx 1 week ago, and not replaced, area is scabbed over. Pt shaking and only responsive to name at triage

## 2024-10-20 NOTE — ED Notes (Signed)
 Patient unable to tolerate bair hugger at this time.  Bair hugger removed and warm blankets applied.  Seizure pads applied to side rails to prevents injury due to patient kicking and putting legs through railing.

## 2024-10-20 NOTE — Sepsis Progress Note (Signed)
 eLink is following this Code Sepsis.

## 2024-10-21 DIAGNOSIS — Z7189 Other specified counseling: Secondary | ICD-10-CM

## 2024-10-21 DIAGNOSIS — Z515 Encounter for palliative care: Secondary | ICD-10-CM

## 2024-10-21 DIAGNOSIS — E860 Dehydration: Secondary | ICD-10-CM

## 2024-10-21 DIAGNOSIS — E870 Hyperosmolality and hypernatremia: Secondary | ICD-10-CM

## 2024-10-21 LAB — CBC
HCT: 38.9 % — ABNORMAL LOW (ref 39.0–52.0)
Hemoglobin: 12.4 g/dL — ABNORMAL LOW (ref 13.0–17.0)
MCH: 31.2 pg (ref 26.0–34.0)
MCHC: 31.9 g/dL (ref 30.0–36.0)
MCV: 97.7 fL (ref 80.0–100.0)
Platelets: 78 K/uL — ABNORMAL LOW (ref 150–400)
RBC: 3.98 MIL/uL — ABNORMAL LOW (ref 4.22–5.81)
RDW: 16.9 % — ABNORMAL HIGH (ref 11.5–15.5)
WBC: 12.2 K/uL — ABNORMAL HIGH (ref 4.0–10.5)
nRBC: 0 % (ref 0.0–0.2)

## 2024-10-21 LAB — MAGNESIUM: Magnesium: 3.3 mg/dL — ABNORMAL HIGH (ref 1.7–2.4)

## 2024-10-21 LAB — COMPREHENSIVE METABOLIC PANEL WITH GFR
ALT: 30 U/L (ref 0–44)
AST: 112 U/L — ABNORMAL HIGH (ref 15–41)
Albumin: 3.2 g/dL — ABNORMAL LOW (ref 3.5–5.0)
Alkaline Phosphatase: 72 U/L (ref 38–126)
Anion gap: 16 — ABNORMAL HIGH (ref 5–15)
BUN: 88 mg/dL — ABNORMAL HIGH (ref 8–23)
CO2: 22 mmol/L (ref 22–32)
Calcium: 9 mg/dL (ref 8.9–10.3)
Chloride: 122 mmol/L — ABNORMAL HIGH (ref 98–111)
Creatinine, Ser: 2.68 mg/dL — ABNORMAL HIGH (ref 0.61–1.24)
GFR, Estimated: 23 mL/min — ABNORMAL LOW
Glucose, Bld: 111 mg/dL — ABNORMAL HIGH (ref 70–99)
Potassium: 4.3 mmol/L (ref 3.5–5.1)
Sodium: 160 mmol/L — ABNORMAL HIGH (ref 135–145)
Total Bilirubin: 0.9 mg/dL (ref 0.0–1.2)
Total Protein: 7.1 g/dL (ref 6.5–8.1)

## 2024-10-21 LAB — PROTIME-INR
INR: 1.1 (ref 0.8–1.2)
Prothrombin Time: 14.6 s (ref 11.4–15.2)

## 2024-10-21 LAB — BASIC METABOLIC PANEL WITH GFR
Anion gap: 12 (ref 5–15)
BUN: 74 mg/dL — ABNORMAL HIGH (ref 8–23)
CO2: 27 mmol/L (ref 22–32)
Calcium: 9.4 mg/dL (ref 8.9–10.3)
Chloride: 119 mmol/L — ABNORMAL HIGH (ref 98–111)
Creatinine, Ser: 1.99 mg/dL — ABNORMAL HIGH (ref 0.61–1.24)
GFR, Estimated: 32 mL/min — ABNORMAL LOW
Glucose, Bld: 82 mg/dL (ref 70–99)
Potassium: 4.4 mmol/L (ref 3.5–5.1)
Sodium: 158 mmol/L — ABNORMAL HIGH (ref 135–145)

## 2024-10-21 LAB — PHOSPHORUS: Phosphorus: 4.9 mg/dL — ABNORMAL HIGH (ref 2.5–4.6)

## 2024-10-21 MED ORDER — DEXTROSE 5 % IV SOLN: INTRAVENOUS | Status: DC

## 2024-10-21 NOTE — Consult Note (Signed)
 "                                                                                   Consultation Note Date: 10/21/2024   Patient Name: Bruce Daniel  DOB: 03/22/1941  MRN: [id number]  Age / Sex: 83 y.o., male  PCP: [name], MD Referring Physician: Sherrill Alejandro Donovan, DO  Reason for Consultation: {Reason for Consult:23484}  HPI/Patient Profile: 83 y.o. male  with past medical history of *** admitted on 10/20/2024 with ***.   Clinical Assessment and Goals of Care: ***  {Primary Decision Fjxzm:78612}    SUMMARY OF RECOMMENDATIONS   Goals of care discussions with son Mr Collard on phone:   DNR   Continue Abx Remdesivir  and other current measures  Son states that he had contacted the [facility] 1-2 weeks ago, he will get in touch them with 10-23-24 to see if patient can be supported at a [facility] facility and is accepting of outpatient palliative support.   Time trial of current interventions, at least for the weekend, palliative care to follow along and help guide further GOC discussions, especially in terms of whether or not the PEG tube ought to be re inserted, towards the end of this hospitalization and also for whether hospice or palliative services are appropriate going forward. As per last onc note (10-06-24 at [facility])- no acute progression but patient with ongoing functional decline and failure to thrive.   Code Status/Advance Care Planning: {Palliative Code status:23503}   Symptom Management:  ***  Palliative Prophylaxis:  {Palliative Prophylaxis:21015}  Additional Recommendations (Limitations, Scope, Preferences): {Recommended Scope and Preferences:21019}  Psycho-social/Spiritual:  Desire for further Chaplaincy support:{YES NO:22349} Additional Recommendations: {PAL SOCIAL:21064}  Prognosis:  {Palliative Care Prognosis:23504}  Discharge Planning: {Palliative dispostion:23505}      Primary Diagnoses: Present on Admission:  Hypernatremia  BPH (benign prostatic  hyperplasia)  Laryngeal cancer (HCC)  COVID-19 virus infection   I have reviewed the medical record, interviewed the patient and family, and examined the patient. The following aspects are pertinent.  Past Medical History:  Diagnosis Date   Cancer (HCC)    LARINGEAL CA   Hyperlipidemia    Hypertension    Pelvic fracture (HCC) 06/2013   TRAUMA   Social History   Socioeconomic History   Marital status: Divorced    Spouse name: Not on file   Number of children: Not on file   Years of education: Not on file   Highest education level: Not on file  Occupational History   Not on file  Tobacco Use   Smoking status: Former    Current packs/day: 0.00    Types: Cigarettes    Quit date: 04/25/2012    Years since quitting: 12.4   Smokeless tobacco: Never  Substance and Sexual Activity   Alcohol use: Yes    Alcohol/week: 1.0 standard drink of alcohol    Types: 1 Cans of beer per week   Drug use: No   Sexual activity: Not on file  Other Topics Concern   Not on file  Social History Narrative   Not on file   Social Drivers of Health   Tobacco Use: Medium Risk (08/17/2024)  Received from [facility]   Patient History    Smoking Tobacco Use: Former    Smokeless Tobacco Use: Never    Passive Exposure: Not on file  Financial Resource Strain: Not on file  Food Insecurity: Patient Unable To Answer (10/21/2024)   Epic    Worried About Programme Researcher, Broadcasting/film/video in the Last Year: Patient unable to answer    Ran Out of Food in the Last Year: Patient unable to answer  Transportation Needs: Unknown (10/21/2024)   Epic    Lack of Transportation (Medical): No    Lack of Transportation (Non-Medical): Not on file  Physical Activity: Not on file  Stress: Not on file  Social Connections: Unknown (10/21/2024)   Social Connection and Isolation Panel    Frequency of Communication with Friends and Family: Not on file    Frequency of Social Gatherings with Friends and Family: More than three  times a week    Attends Religious Services: Not on file    Active Member of Clubs or Organizations: Not on file    Attends Banker Meetings: Not on file    Marital Status: Not on file  Depression (PHQ2-9): Not on file  Alcohol Screen: Not on file  Housing: Unknown (10/21/2024)   Epic    Unable to Pay for Housing in the Last Year: No    Number of Times Moved in the Last Year: 1    Homeless in the Last Year: Patient unable to answer  Utilities: Patient Unable To Answer (10/21/2024)   Epic    Threatened with loss of utilities: Patient unable to answer  Health Literacy: Not on file   No family history on file. Scheduled Meds:  heparin   5,000 Units Subcutaneous Q8H   sodium chloride  flush  3 mL Intravenous Q12H   Continuous Infusions:  remdesivir  100 mg in sodium chloride  0.9 % 100 mL IVPB 100 mg (10/21/24 1232)   PRN Meds:.Glycerin  (Adult), ondansetron  **OR** ondansetron  (ZOFRAN ) IV Medications Prior to Admission:  Prior to Admission medications  Medication Sig Start Date End Date Taking? Authorizing Provider  atenolol (TENORMIN) 50 MG tablet Take 50 mg by mouth daily. 10/05/24  Yes [provider]  atorvastatin  (LIPITOR) 80 MG tablet Take 40 mg by mouth at bedtime.   Yes [provider]  lisinopril  (PRINIVIL ,ZESTRIL ) 20 MG tablet Take 10 mg by mouth 2 (two) times daily.   Yes [provider]  tamsulosin  (FLOMAX ) 0.4 MG CAPS capsule Take 0.4 mg by mouth at bedtime.   Yes [provider]  albuterol  (PROVENTIL ) (5 MG/ML) 0.5% nebulizer solution Take 0.5 mLs (2.5 mg total) by nebulization 2 (two) times daily. 07/27/13   Riebock, Sherri, NP  aspirin  EC 81 MG tablet Take 81 mg by mouth daily.    [provider]  docusate sodium  (COLACE) 100 MG capsule Take 100 mg by mouth at bedtime.    [provider]  HYDROcodone-acetaminophen  (NORCO/VICODIN) 5-325 MG per tablet Take 1 tablet by mouth 2 (two) times daily. BEFORE THERAPY AND  BEFORE BEDTIME    [provider]  HYDROcodone-acetaminophen  (NORCO/VICODIN) 5-325 MG per tablet Take 1 tablet by mouth every 6 (six) hours as needed for pain.    [provider]  ipratropium (ATROVENT ) 0.02 % nebulizer solution Take 2.5 mLs (0.5 mg total) by nebulization 2 (two) times daily. 07/27/13   Riebock, Sherri, NP  Melatonin 5 MG TABS Take 5 mg by mouth at bedtime.    [provider]  polyethylene  glycol (MIRALAX  / GLYCOLAX ) packet Take 17 g by mouth daily. 07/27/13   Riebock, Sherri, NP  pregabalin  (LYRICA ) 75 MG capsule Take 75 mg by mouth 2 (two) times daily.    [provider]  traMADol  (ULTRAM ) 50 MG tablet Take 50-100 mg by mouth every 6 (six) hours as needed. 07/27/13   Nancye Sherri, NP   Allergies[1] Review of Systems  Physical Exam  Vital Signs: BP (!) 158/71   Pulse 88   Temp 97.7 F (36.5 C)   Resp 16   SpO2 100%  Pain Scale: PAINAD       SpO2: SpO2: 100 % O2 Device:SpO2: 100 % O2 Flow Rate: .O2 Flow Rate (L/min): 2.5 L/min  IO: Intake/output summary:  Intake/Output Summary (Last 24 hours) at 10/21/2024 1509 Last data filed at 10/21/2024 0201 Gross per 24 hour  Intake 3749.18 ml  Output --  Net 3749.18 ml    LBM: Last BM Date : 10/21/24 Baseline Weight:   Most recent weight:       Palliative Assessment/Data:     Time In: *** Time Out: *** Time Total: *** Greater than 50%  of this time was spent counseling and coordinating care related to the above assessment and plan.  Signed by: Lonia Serve, MD   Please contact Palliative Medicine Team phone at [phone] for questions and concerns.  For individual provider: See Amion                 [1]  Allergies Allergen Reactions   Other Other (See Comments)    Flu Vaccine, bad feelings   Venlafaxine Hcl Nausea Only   Bactrim [Sulfamethoxazole-Trimethoprim] Other (See Comments)    Unable to taste   Simvastatin Other (See Comments)    Leg pain    Sulfamethoxazole Other (See Comments)    Loss of Taste after taking   "

## 2024-10-21 NOTE — Progress Notes (Signed)
 " PROGRESS NOTE    Nollan Muldrow  FMW:[id number] DOB: 1941/10/26 DOA: 10/20/2024 PCP: [name], MD   Brief Narrative:  HPI per Dr. Will Bernard Alec Jaros is a 83 y.o. 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  His initial diagnosis was in 2013.  He underwent radiation at that time.  He received radiation from September 23 to November 13.  The patient was hospitalized from October 22 through 29.  He was discharged from the hospital to the nursing home for rehab.  He remained in the nursing home until approximately 2 weeks ago.  The patient was able to walk with a walker at the time of discharge.  The patient's longtime girlfriend is his main caregiver.  She reports that the patient's PEG tube came out about 3 days ago but he refused to go to the ER or seek any care until today.  The patient fell from the commode today and hit the base of his neck on the metal bar across his wheelchair.  He was difficult to arouse after that fall.  Someone was able to put him in a recliner and his caregiver called 911 but even after they arrived he was taken away barely responding.  She says when she got to the ER he was a little bit more responsive and has woken up more since that time.  The patient was also found to be COVID-positive in the emergency department. No sob or cough or fever. He receives much of his care through the [facility]. The patient is being admitted for severe dehydration and severe hypernatremia.  He currently has no way to receive nutrition or hydration other than IV.  He will be admitted by the medical team with IV fluids overnight but palliative care will be consulted for goals of care discussion.  The patient's son who is his healthcare power of attorney is coming in from out of town and will be here tomorrow.  So CODE STATUS and decisions about whether or not to consider another feeding tube will need to be decided. Even with the PEG tube  the patient's significant other tells me that he often will refuse the liquid nutrition that when in the PEG tube.  He also still would try to eat by mouth at times.   **Interim History Na+ is slowly improving. PEG has been removed and site has scabbed over. Awaiting Palliative GOC Discussion  Assessment and Plan:  HyperNatremia: In the setting of Dehydration; He has no way to receive oral nutrition - IVF expired but will resume w/ D5W. Family and palliative care will discuss whether or not the patient should have another PEG. Na+ Trend:  Recent Labs  Lab 10/20/24 1821 10/21/24 0449  NA 164* 160*   Dehydration: Resume IVF as above  Abnormal LFTs: in the setting of Dehydration. AST went from 81 -> 112. CTM & Trend and repeat CMP in the AM  Recurrent Laryngeal Cancer: Palliative care consult for goals of care discussion. Son to arrive tomorrow. Full code for tonight. [name] hospice appropriate.   Severe Aortic Stenosis: per records - girlfriend tells me he was felt to be too ill to have the valve replacement procedure.   Essential HTN: the patient has no means for receiving routine medication as PEG is out.  Will monitor for now and use PRN's if necessary  COVID: is an incidental finding ; He is not requiring oxygen.  NO fevers or sob. It is unclear how many days he has been sick.  He did start to feel worse 2 days ago, but that is after his PEG tube came out and he has been without any hydration or nutrition since that time. Check Inflammatory Markers in the AM. Getting Remdesivir    AKI: BUN/Cr Trend: Recent Labs  Lab 10/20/24 1821 10/21/24 0449  BUN 110* 88*  CREATININE 3.33* 2.68*  -Resume IVF as above -Avoid Nephrotoxic Medications, Contrast Dyes, Hypotension and Dehydration to Ensure Adequate Renal Perfusion and will need to Renally Adjust Meds. CTM & Trend Renal  Fxn carefully & will repeat CMP in the AM  Normocytic Anemia: Hgb/Hct Trend:  Recent Labs  Lab 10/20/24 1821 10/21/24 0449  HGB 13.8 12.4*  HCT 45.0 38.9*  MCV 100.2* 97.7  -Check Anemia Panel in the AM. CTM for S/Sx of Bleeding; No overt bleeding noted. Repeat CBC in the AM  Thrombocytopenia: Plt Count went from 140 -> 78. CTM for S/Sx of Bleeding; No overt bleeding noted. Discontinue Hepatin 5,000 units sq q8h. Repeat CBC in th AM   Hypoalbuminemia: Patient's Albumin Lvl went from 4.0 -> 3.2. CTM & Trend & Repeat CMP in the AM   DVT prophylaxis: Was on Heparin  5,000 units sq q8h but will stop given Thrombocytopenia    Code Status: Limited: Do not attempt resuscitation (DNR) -DNR-LIMITED -Do Not Intubate/DNI  Family Communication: No family present @ bedside  Disposition Plan:  Level of care: Med-Surg Status is: Inpatient Remains inpatient appropriate because: Needs further clinical improvement and evaluation by the palliative care team   Consultants:  Palliative care medicine  Procedures:  As delineated as above  Antimicrobials:  Anti-infectives (From admission, onward)    Start     Dose/Rate Route Frequency Ordered Stop   10/21/24 1000  remdesivir  100 mg in sodium chloride  0.9 % 100 mL IVPB       Placed in Followed by Linked Group   100 mg 200 mL/hr over 30 Minutes Intravenous Daily 10/20/24 2302 10/23/24 0959   10/21/24 0000  remdesivir  200 mg in sodium chloride  0.9% 250 mL IVPB       Placed in Followed by Linked Group   200 mg 580 mL/hr over 30 Minutes Intravenous Once 10/20/24 2302 10/21/24 0231   10/20/24 1800  ceFEPIme  (MAXIPIME ) 2 g in sodium chloride  0.9 % 100 mL IVPB        2 g 200 mL/hr over 30 Minutes Intravenous  Once 10/20/24 1754 10/20/24 1850   10/20/24 1800  vancomycin  (VANCOCIN ) IVPB 1000 mg/200 mL premix        1,000 mg 200 mL/hr over 60 Minutes Intravenous  Once 10/20/24 1754 10/20/24 1946       Subjective: Seen and examined at bedside  and was not complaining of any pain.  Denies shortness of breath.  Felt fatigued and wanted to go home.  Denied any other concerns or complaints at this time  Objective: Vitals:   10/21/24 0534 10/21/24 0833 10/21/24 1218 10/21/24 1938  BP: (!) 155/40 (!) 149/65 (!) 158/71 130/69  Pulse: 80 76 88 96  Resp:  16 16 20   Temp: 97.8 F (36.6 C) (!) 97.3 F (36.3 C) 97.7 F (36.5 C) (!) 97.3 F (36.3 C)  TempSrc: Oral     SpO2: 100% 93% 100% 96%    Intake/Output Summary (Last 24 hours) at 10/21/2024 1952 Last data filed at  10/21/2024 1232 Gross per 24 hour  Intake 549.18 ml  Output --  Net 549.18 ml   There were no vitals filed for this visit.  Examination: Physical Exam:  Constitutional: In very frail and cachectic thin and very frail elderly cachectic Caucasian male who appears very fatigued Respiratory: Diminished to auscultation bilaterally with some coarse breath sounds, no wheezing, rales, rhonchi or crackles. Normal respiratory effort and patient is not tachypenic. No accessory muscle use.  Not wearing any supplemental oxygen nasal cannula Cardiovascular: RRR, no murmurs / rubs / gallops. S1 and S2 auscultated. No extremity edema.  Abdomen: Soft, non-tender, non-distended.  PEG site is scabbed over. Bowel sounds positive.  GU: Deferred. Musculoskeletal: No clubbing / cyanosis of digits/nails. No joint deformity upper and lower extremities.  Skin: No rashes, lesions, ulcers on a limited skin evaluation. No induration; Warm and dry.  Neurologic: CN 2-12 grossly intact with no focal deficits but is a little hard of hearing. Romberg sign cerebellar reflexes not assessed.  Psychiatric: Awake and alert and appears calm  Data Reviewed: I have personally reviewed following labs and imaging studies  CBC: Recent Labs  Lab 10/20/24 1821 10/21/24 0449  WBC 19.7* 12.2*  NEUTROABS 17.8*  --   HGB 13.8 12.4*  HCT 45.0 38.9*  MCV 100.2* 97.7  PLT 140* 78*   Basic Metabolic  Panel: Recent Labs  Lab 10/20/24 1821 10/21/24 0449  NA 164* 160*  K 4.4 4.3  CL 118* 122*  CO2 22 22  GLUCOSE 128* 111*  BUN 110* 88*  CREATININE 3.33* 2.68*  CALCIUM  10.1 9.0  MG  --  3.3*  PHOS  --  4.9*   GFR: CrCl cannot be calculated (Unknown ideal weight.). Liver Function Tests: Recent Labs  Lab 10/20/24 1821 10/21/24 0449  AST 81* 112*  ALT 33 30  ALKPHOS 89 72  BILITOT 1.0 0.9  PROT 8.7* 7.1  ALBUMIN 4.0 3.2*   No results for input(s): LIPASE, AMYLASE in the last 168 hours. No results for input(s): AMMONIA in the last 168 hours. Coagulation Profile: Recent Labs  Lab 10/21/24 0449  INR 1.1   Cardiac Enzymes: No results for input(s): CKTOTAL, CKMB, CKMBINDEX, TROPONINI in the last 168 hours. BNP (last 3 results) No results for input(s): PROBNP in the last 8760 hours. HbA1C: No results for input(s): HGBA1C in the last 72 hours. CBG: No results for input(s): GLUCAP in the last 168 hours. Lipid Profile: No results for input(s): CHOL, HDL, LDLCALC, TRIG, CHOLHDL, LDLDIRECT in the last 72 hours. Thyroid Function Tests: No results for input(s): TSH, T4TOTAL, FREET4, T3FREE, THYROIDAB in the last 72 hours. Anemia Panel: No results for input(s): VITAMINB12, FOLATE, FERRITIN, TIBC, IRON, RETICCTPCT in the last 72 hours. Sepsis Labs: Recent Labs  Lab 10/20/24 1820 10/20/24 2243  LATICACIDVEN 3.5* 1.1   Recent Results (from the past 240 hours)  Blood Culture (routine x 2)     Status: None (Preliminary result)   Collection Time: 10/20/24  6:14 PM   Specimen: BLOOD RIGHT ARM  Result Value Ref Range Status   Specimen Description   Final    BLOOD RIGHT ARM Performed at [facility], [address], [place], [place] [address]    Special Requests   Final    BOTTLES DRAWN AEROBIC AND ANAEROBIC Blood Culture results may not be optimal due to an inadequate volume of blood received in  culture bottles Performed at [facility], [address], [place], [place] [address]  Culture   Final    NO GROWTH < 12 HOURS Performed at [facility], [address], [place], [place] [address]    Report Status PENDING  Incomplete  Blood Culture (routine x 2)     Status: None (Preliminary result)   Collection Time: 10/20/24  6:17 PM   Specimen: BLOOD LEFT FOREARM  Result Value Ref Range Status   Specimen Description   Final    BLOOD LEFT FOREARM Performed at [facility], [address], [place], [place] [address]    Special Requests   Final    BOTTLES DRAWN AEROBIC AND ANAEROBIC Blood Culture results may not be optimal due to an inadequate volume of blood received in culture bottles Performed at [facility], [address], [place], [place] [address]    Culture   Final    NO GROWTH < 12 HOURS Performed at [facility], [address] [place], [place] [address]    Report Status PENDING  Incomplete  Resp panel by RT-PCR (RSV, Flu A&B, Covid) Anterior Nasal Swab     Status: Abnormal   Collection Time: 10/20/24  6:34 PM   Specimen: Anterior Nasal Swab  Result Value Ref Range Status   SARS Coronavirus 2 by RT PCR POSITIVE (A) NEGATIVE Final    Comment: (NOTE) SARS-CoV-2 target nucleic acids are DETECTED.  The SARS-CoV-2 RNA is generally detectable in upper respiratory specimens during the acute phase of infection. Positive results are indicative of the presence of the identified virus, but do not rule out bacterial infection or co-infection with other pathogens not detected by the test. Clinical correlation with patient history and other diagnostic information is necessary to determine patient infection status. The expected result is Negative.  Fact Sheet for Patients: bloggercourse.com  Fact Sheet for Healthcare Providers: seriousbroker.it  This test  is not yet approved or cleared by the [place]  FDA and  has been authorized for detection and/or diagnosis of SARS-CoV-2 by FDA under an Emergency Use Authorization (EUA).  This EUA will remain in effect (meaning this test can be used) for the duration of  the COVID-19 declaration under Section 564(b)(1) of the A ct, 21 U.S.C. section 360bbb-3(b)(1), unless the authorization is terminated or revoked sooner.     Influenza A by PCR NEGATIVE NEGATIVE Final   Influenza B by PCR NEGATIVE NEGATIVE Final    Comment: (NOTE) The Xpert Xpress SARS-CoV-2/FLU/RSV plus assay is intended as an aid in the diagnosis of influenza from Nasopharyngeal swab specimens and should not be used as a sole basis for treatment. Nasal washings and aspirates are unacceptable for Xpert Xpress SARS-CoV-2/FLU/RSV testing.  Fact Sheet for Patients: bloggercourse.com  Fact Sheet for Healthcare Providers: seriousbroker.it  This test is not yet approved or cleared by the [place]  FDA and has been authorized for detection and/or diagnosis of SARS-CoV-2 by FDA under an Emergency Use Authorization (EUA). This EUA will remain in effect (meaning this test can be used) for the duration of the COVID-19 declaration under Section 564(b)(1) of the Act, 21 U.S.C. section 360bbb-3(b)(1), unless the authorization is terminated or revoked.     Resp Syncytial Virus by PCR NEGATIVE NEGATIVE Final    Comment: (NOTE) Fact Sheet for Patients: bloggercourse.com  Fact Sheet for Healthcare Providers: seriousbroker.it  This test is not yet approved or cleared by the [place]  FDA and has been authorized for detection and/or diagnosis of SARS-CoV-2 by FDA under an Emergency Use Authorization (EUA). This  EUA will remain in effect (meaning this test can be used) for the duration of the COVID-19 declaration under Section  564(b)(1) of the Act, 21 U.S.C. section 360bbb-3(b)(1), unless the authorization is terminated or revoked.  Performed at [facility], [address], [place], [place] [address]     Radiology Studies: [facility] [address] Chest Port 1 View Result Date: 10/20/2024 EXAM: 1 VIEW(S) XRAY OF THE CHEST 10/20/2024 06:58:00 PM COMPARISON: None available. CLINICAL HISTORY: Questionable sepsis - evaluate for abnormality FINDINGS: LUNGS AND PLEURA: The lungs are symmetrically hyperinflated in keeping with changes of underlying COPD. Patchy right lower lobe airspace opacity, suspicious for pneumonia. No pleural effusion. No pneumothorax. HEART AND MEDIASTINUM: Thoracic aortic atherosclerosis. BONES AND SOFT TISSUES: No acute osseous abnormality. IMPRESSION: 1. Patchy right lower lobe airspace opacity, suspicious for pneumonia. 2. Symmetrically hyperinflated lungs, consistent with underlying COPD. Electronically signed by: Dorethia Molt MD 10/20/2024 08:37 PM EST RP Workstation: [id number]   Scheduled Meds:  sodium chloride  flush  3 mL Intravenous Q12H   Continuous Infusions:  dextrose      remdesivir  100 mg in sodium chloride  0.9 % 100 mL IVPB 100 mg (10/21/24 1232)    LOS: 1 day   Alejandro Marker, DO [facility] Available via Epic secure chat 7am-7pm After these hours, please refer to coverage provider listed on [email or web address] 10/21/2024, 7:52 PM  "

## 2024-10-21 NOTE — Hospital Course (Signed)
 Bruce Daniel is a 83 y.o. 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 He remained in the nursing home until approximately 2 weeks ago.  The patient was able to walk with a walker at the time of discharge but approximately 3 days prior to admission his PEG tube came out and he refused to go to the ER or seek any care.  Subsequently the patient fell from the commode today and hit the base of his neck on the metal bar across his wheelchair.  He was difficult to arouse after that fall.  Someone was able to put him in a recliner and his caregiver called 911 but even after they arrived he was taken away barely responding.  Brought in and was found to have significant hyponatremia and COVID.  **Interim History Na+ is slowly improving.  Send this PEG has been removed and site has scabbed over and patient does not want it replaced. Inflammatory Markers are elevated due to COVID and CXR shows worsening so will start Steroids. Palliative following for GOC Discussion and current plan is to continue IV fluids and current mode of care and pursue inpatient hospice care.  He has been faxed out to the [facility] hospice and will pursue [facility] if not possible for the [facility] in [place].   Assessment and Plan:  HyperNatremia: In the setting of Dehydration; He has no way to receive oral nutrition. IVF w/ D5W at 75 mL/hr. Family and patient DO NOT want another PEG. Na+ Trend Improving and went from 164 -> 160 -> 158 -> 155 -> 150 -> 147.  -After further goals of care discussion the patient and the family have made it clear that he does not want a PEG tube.  The current plan is to continue IV fluids and current mode of care for now and have no temporary feedings.  The family is liklely pursuing residential hospice with her first choice being the [facility] in [place] or [facility]  if that is not possible.  The [facility] team has faxed the patient out for hospice screening and family is agreeable to pursue inpatient hospice care and [facility] working with the [facility] and coordinating care  Dehydration: Resume IVF as above w/ D5W and will continue now until transitioned to Residential Hospice at the [facility] or [facility]  Abnormal LFTs: in the setting of Dehydration. AST went from 81 -> 112 -> 110 -> 59 -> 44. CTM & Trend and repeat CMP in the AM  Recurrent Laryngeal Cancer: Palliative care consult for goals of care discussion. Son to arrive at some time and he is Now DNR.SABRA [name] hospice appropriate and will need continued goals of care discussion given that he does not want his PEG tube replaced.  He remains DNR   Severe Aortic Stenosis: per records - S/o tells me he was felt to be too ill to have the valve replacement procedure.   Essential HTN: the patient has no means for receiving routine medication as PEG is out.  Will monitor for now and use PRN's if necessary.  Continue monitor blood pressure per protocol last blood pressure reading was 154/59  Hypokalemia: K+ is 3.2. Replete w/ IV Kcl 40 mEQ. CTM & Replete as Necessary.    COVID Pneumonia: is an  incidental finding and intiitally was not requiring oxygen but now on O2.  NO fevers or sob. It is unclear how many days he has been sick.  He did start to feel worse 2 days ago, but that is after his PEG tube came out and he has been without any hydration or nutrition since that time. Check Inflammatory Markers: Recent Labs    10/22/24 0910 10/23/24 0733 10/24/24 0448  DDIMER 2.62*  --  8.68*  FERRITIN 510* 429* 455*  LDH 467* 369* 404*  CRP 22.3* 21.1* 18.0*  Patient's ESR went from 67 -> 65 -> 65 and Fibrinogen  went from 574 -> 499 -> 541 Lab Results  Component Value Date   SARSCOV2NAA POSITIVE (A) 10/20/2024    SARSCOV2NAA Not Detected 06/14/2019  -Getting Remdesivir . Started him on Steroids w/ 0.5 mg/kg BID x3 Days and then Prednisone . give how high his Inflammatory Markers are -D-Dimer went up but unfortunately not on Pharmacologic VTE Prophylaxis due to Thrombocytopenia -Repeat CXR In the AM; CXR 12/30 showed Persistent bibasilar opacities with mild improvement from yesterday.   AKI: Baseline in our system is around 1.2. BUN/Cr went from 110/3.33 -> 88/2.68 -> 74/1.99 -> 74/1.93 -> 67/1.67 -> 60/1.63. Resume IVF as above and will continue. Avoid Nephrotoxic Medications, Contrast Dyes, Hypotension and Dehydration to Ensure Adequate Renal Perfusion and will need to Renally Adjust Meds. CTM & Trend Renal Fxn carefully & will repeat CMP in the AM  Normocytic Anemia: Hgb/Hct Trend dropped in the setting of dilution. Hgb/Hct relatively stable and is now 11.6/35.0. Checked Anemia Panel and showed an iron level of 21, UIBC of 158, TIBC 179, saturation show 12%, ferritin 510, folate level 10.2 and will check vitamin B12 level. CTM for S/Sx of Bleeding; No overt bleeding noted. Repeat CBC in the AM  Thrombocytopenia: Plt Count went from 140 -> 78 -> 67 -> 56. CTM for S/Sx of Bleeding; No overt bleeding noted. Discontinue Hepatin 5,000 units sq q8h. Repeat CBC in th AM   Hypoalbuminemia: Patient's Albumin Lvl went from 4.0 -> 3.2 -> 3.0 -> 2.8. CTM & Trend & Repeat CMP in the AM

## 2024-10-21 NOTE — Plan of Care (Signed)

## 2024-10-22 ENCOUNTER — Inpatient Hospital Stay (HOSPITAL_COMMUNITY)

## 2024-10-22 DIAGNOSIS — Z515 Encounter for palliative care: Secondary | ICD-10-CM

## 2024-10-22 DIAGNOSIS — Z7189 Other specified counseling: Secondary | ICD-10-CM

## 2024-10-22 LAB — CBC WITH DIFFERENTIAL/PLATELET
Abs Immature Granulocytes: 0.07 K/uL (ref 0.00–0.07)
Basophils Absolute: 0 K/uL (ref 0.0–0.1)
Basophils Relative: 0 %
Eosinophils Absolute: 0 K/uL (ref 0.0–0.5)
Eosinophils Relative: 0 %
HCT: 39.1 % (ref 39.0–52.0)
Hemoglobin: 12.6 g/dL — ABNORMAL LOW (ref 13.0–17.0)
Immature Granulocytes: 1 %
Lymphocytes Relative: 7 %
Lymphs Abs: 0.8 K/uL (ref 0.7–4.0)
MCH: 30.8 pg (ref 26.0–34.0)
MCHC: 32.2 g/dL (ref 30.0–36.0)
MCV: 95.6 fL (ref 80.0–100.0)
Monocytes Absolute: 0.7 K/uL (ref 0.1–1.0)
Monocytes Relative: 6 %
Neutro Abs: 10 K/uL — ABNORMAL HIGH (ref 1.7–7.7)
Neutrophils Relative %: 86 %
Platelets: 67 K/uL — ABNORMAL LOW (ref 150–400)
RBC: 4.09 MIL/uL — ABNORMAL LOW (ref 4.22–5.81)
RDW: 16.7 % — ABNORMAL HIGH (ref 11.5–15.5)
Smear Review: NORMAL
WBC: 11.6 K/uL — ABNORMAL HIGH (ref 4.0–10.5)
nRBC: 0 % (ref 0.0–0.2)

## 2024-10-22 LAB — IRON AND TIBC
Iron: 21 ug/dL — ABNORMAL LOW (ref 45–182)
Saturation Ratios: 12 % — ABNORMAL LOW (ref 17.9–39.5)
TIBC: 179 ug/dL — ABNORMAL LOW (ref 250–450)
UIBC: 158 ug/dL

## 2024-10-22 LAB — LACTATE DEHYDROGENASE: LDH: 467 U/L — ABNORMAL HIGH (ref 105–235)

## 2024-10-22 LAB — FERRITIN: Ferritin: 510 ng/mL — ABNORMAL HIGH (ref 24–336)

## 2024-10-22 LAB — RETICULOCYTES
Immature Retic Fract: 10.9 % (ref 2.3–15.9)
RBC.: 4.12 MIL/uL — ABNORMAL LOW (ref 4.22–5.81)
Retic Count, Absolute: 43.3 K/uL (ref 19.0–186.0)
Retic Ct Pct: 1.1 % (ref 0.4–3.1)

## 2024-10-22 LAB — PHOSPHORUS: Phosphorus: 3.7 mg/dL (ref 2.5–4.6)

## 2024-10-22 LAB — COMPREHENSIVE METABOLIC PANEL WITH GFR
ALT: 38 U/L (ref 0–44)
AST: 110 U/L — ABNORMAL HIGH (ref 15–41)
Albumin: 3 g/dL — ABNORMAL LOW (ref 3.5–5.0)
Alkaline Phosphatase: 71 U/L (ref 38–126)
Anion gap: 12 (ref 5–15)
BUN: 74 mg/dL — ABNORMAL HIGH (ref 8–23)
CO2: 24 mmol/L (ref 22–32)
Calcium: 9 mg/dL (ref 8.9–10.3)
Chloride: 120 mmol/L — ABNORMAL HIGH (ref 98–111)
Creatinine, Ser: 1.93 mg/dL — ABNORMAL HIGH (ref 0.61–1.24)
GFR, Estimated: 34 mL/min — ABNORMAL LOW
Glucose, Bld: 141 mg/dL — ABNORMAL HIGH (ref 70–99)
Potassium: 3.9 mmol/L (ref 3.5–5.1)
Sodium: 155 mmol/L — ABNORMAL HIGH (ref 135–145)
Total Bilirubin: 0.8 mg/dL (ref 0.0–1.2)
Total Protein: 6.8 g/dL (ref 6.5–8.1)

## 2024-10-22 LAB — FOLATE: Folate: 10.2 ng/mL

## 2024-10-22 LAB — FIBRINOGEN: Fibrinogen: 574 mg/dL — ABNORMAL HIGH (ref 210–475)

## 2024-10-22 LAB — MAGNESIUM: Magnesium: 2.9 mg/dL — ABNORMAL HIGH (ref 1.7–2.4)

## 2024-10-22 LAB — D-DIMER, QUANTITATIVE: D-Dimer, Quant: 2.62 ug{FEU}/mL — ABNORMAL HIGH (ref 0.00–0.50)

## 2024-10-22 LAB — C-REACTIVE PROTEIN: CRP: 22.3 mg/dL — ABNORMAL HIGH

## 2024-10-22 LAB — SEDIMENTATION RATE: Sed Rate: 67 mm/h — ABNORMAL HIGH (ref 0–16)

## 2024-10-22 MED ORDER — FOLIC ACID 5 MG/ML IJ SOLN
1.0000 mg | Freq: Every day | INTRAMUSCULAR | Status: DC
  Administered 2024-10-22 – 2024-10-25 (×4): 1 mg via INTRAVENOUS
  Filled 2024-10-22: qty 0.2

## 2024-10-22 MED ORDER — METHYLPREDNISOLONE SODIUM SUCC 40 MG IJ SOLR
0.5000 mg/kg | Freq: Two times a day (BID) | INTRAMUSCULAR | Status: AC
  Administered 2024-10-22 – 2024-10-25 (×6): 22 mg via INTRAVENOUS
  Filled 2024-10-22: qty 1

## 2024-10-22 MED ORDER — THIAMINE HCL 100 MG/ML IJ SOLN
100.0000 mg | Freq: Every day | INTRAMUSCULAR | Status: DC
  Administered 2024-10-22 – 2024-10-25 (×4): 100 mg via INTRAVENOUS
  Filled 2024-10-22: qty 2

## 2024-10-22 MED ORDER — PREDNISONE 50 MG PO TABS: 50.0000 mg | ORAL_TABLET | Freq: Every day | ORAL | Status: DC
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
## 2024-10-22 NOTE — Progress Notes (Signed)
 " PROGRESS NOTE    Bruce Daniel  FMW:[id number] DOB: January 17, 1941 DOA: 10/20/2024 PCP: [name], MD   Brief Narrative:  HPI per Dr. Will Bernard Jamisen Daniel is a 83 y.o. 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  His initial diagnosis was in 2013.  He underwent radiation at that time.  He received radiation from September 23 to November 13.  The patient was hospitalized from October 22 through 29.  He was discharged from the hospital to the nursing home for rehab.  He remained in the nursing home until approximately 2 weeks ago.  The patient was able to walk with a walker at the time of discharge.  The patient's longtime girlfriend is his main caregiver.  She reports that the patient's PEG tube came out about 3 days ago but he refused to go to the ER or seek any care until today.  The patient fell from the commode today and hit the base of his neck on the metal bar across his wheelchair.  He was difficult to arouse after that fall.  Someone was able to put him in a recliner and his caregiver called 911 but even after they arrived he was taken away barely responding.  She says when she got to the ER he was a little bit more responsive and has woken up more since that time.  The patient was also found to be COVID-positive in the emergency department. No sob or cough or fever. He receives much of his care through the [facility]. The patient is being admitted for severe dehydration and severe hypernatremia.  He currently has no way to receive nutrition or hydration other than IV.  He Bruce be admitted by the medical team with IV fluids overnight but palliative care Bruce be consulted for goals of care discussion.  The patient's son who is his healthcare power of attorney is coming in from out of town and Bruce be here tomorrow.  So CODE STATUS and decisions about whether or not to consider another feeding tube Bruce need to be decided. Even with the PEG tube  the patient's significant other tells me that he often Bruce refuse the liquid nutrition that when in the PEG tube.  He also still would try to eat by mouth at times.   **Interim History Na+ is slowly improving. PEG has been removed and site has scabbed over and patient does not want it replaced. Inflammatory Markers are elevated and CXR shows worsening so Bruce start Steroids. Palliative following for GOC Discussion  Assessment and Plan:  HyperNatremia: In the setting of Dehydration; He has no way to receive oral nutrition - IVF expired but Bruce resume w/ D5W at 75 mL/hr. Family and palliative care Bruce discuss whether or not the patient should have another PEG. Na+ Trend:  Recent Labs  Lab 10/20/24 1821 10/21/24 0449 10/21/24 2031 10/22/24 0910  NA 164* 160* 158* 155*   Dehydration: Resume IVF as above w/ D5W  Abnormal LFTs: in the setting of Dehydration. AST went from 81 -> 112 -> 110. CTM & Trend and repeat CMP in the AM  Recurrent Laryngeal Cancer: Palliative care consult for goals of care discussion. Son to arrive at some time and he is Now DNR.SABRA [name] hospice appropriate and Bruce need continued goals of care discussion given that he does not want his PEG  tube replaced.  He remains DNR   Severe Aortic Stenosis: per records - girlfriend tells me he was felt to be too ill to have the valve replacement procedure.   Essential HTN: the patient has no means for receiving routine medication as PEG is out.  Bruce monitor for now and use PRN's if necessary.  Continue monitor blood pressure per protocol last blood pressure reading was 159/63   COVID Pneumonia: is an incidental finding and intiitally was not requiring oxygen but now on O2.  NO fevers or sob. It is unclear how many days he has been sick.  He did start to feel worse 2 days ago, but that is after his PEG tube came out  and he has been without any hydration or nutrition since that time. Check Inflammatory Markers: Recent Labs    10/22/24 0910  DDIMER 2.62*  FERRITIN 510*  LDH 467*  CRP 22.3*  Patient's ESR was 67 and fibrinogen  was 574 Lab Results  Component Value Date   SARSCOV2NAA POSITIVE (A) 10/20/2024   SARSCOV2NAA Not Detected 06/14/2019  -Getting Remdesivir . Bruce start him on Steroids w/ 0.5 mg/kg BID x3 Days and then Prednisone . give how high his Inflammatory Markers are -Repeat CXR In the AM as CXR today showed Patchy opacity persists at the right lung base, with increasing patchy opacity throughout both mid lung zones in the left lung base. Stable heart size and mediastinal contours. Aortic atherosclerosis. No pneumothorax or large pleural effusion.   AKI: BUN/Cr Trend: Recent Labs  Lab 10/20/24 1821 10/21/24 0449 10/21/24 2031 10/22/24 0910  BUN 110* 88* 74* 74*  CREATININE 3.33* 2.68* 1.99* 1.93*  -Resume IVF as above -Avoid Nephrotoxic Medications, Contrast Dyes, Hypotension and Dehydration to Ensure Adequate Renal Perfusion and Bruce need to Renally Adjust Meds. CTM & Trend Renal Fxn carefully & Bruce repeat CMP in the AM  Normocytic Anemia: Hgb/Hct Trend:  Recent Labs  Lab 10/20/24 1821 10/21/24 0449 10/22/24 0910  HGB 13.8 12.4* 12.6*  HCT 45.0 38.9* 39.1  MCV 100.2* 97.7 95.6  -Checked Anemia Panel and showed an iron level of 21, UIBC of 158, TIBC 179, saturation show 12%, ferritin 510, folate level 10.2 and Bruce check vitamin B12 level. CTM for S/Sx of Bleeding; No overt bleeding noted. Repeat CBC in the AM  Thrombocytopenia: Plt Count went from 140 -> 78 -> 67. CTM for S/Sx of Bleeding; No overt bleeding noted. Discontinue Hepatin 5,000 units sq q8h. Repeat CBC in th AM   Hypoalbuminemia: Patient's Albumin Lvl went from 4.0 -> 3.2 -> 3.0. CTM & Trend & Repeat CMP in the AM   DVT prophylaxis: SCDs    Code Status: Limited: Do not attempt resuscitation (DNR) -DNR-LIMITED  -Do Not Intubate/DNI  Family Communication: D/w friend @ bedside  Disposition Plan:  Level of care: Med-Surg Status is: Inpatient Remains inpatient appropriate because: Needs further clinical improvement and continued GOC Discussion   Consultants:  Palliative Care Medicine   Procedures:  As delineated as above  Antimicrobials:  Anti-infectives (From admission, onward)    Start     Dose/Rate Route Frequency Ordered Stop   10/21/24 1000  remdesivir  100 mg in sodium chloride  0.9 % 100 mL IVPB       Placed in Followed by Linked Group   100 mg 200 mL/hr over 30 Minutes Intravenous Daily 10/20/24 2302 10/22/24 1115   10/21/24 0000  remdesivir  200 mg in sodium chloride  0.9% 250 mL IVPB       Placed in  Followed by Linked Group   200 mg 580 mL/hr over 30 Minutes Intravenous Once 10/20/24 2302 10/21/24 0231   10/20/24 1800  ceFEPIme  (MAXIPIME ) 2 g in sodium chloride  0.9 % 100 mL IVPB        2 g 200 mL/hr over 30 Minutes Intravenous  Once 10/20/24 1754 10/20/24 1850   10/20/24 1800  vancomycin  (VANCOCIN ) IVPB 1000 mg/200 mL premix        1,000 mg 200 mL/hr over 60 Minutes Intravenous  Once 10/20/24 1754 10/20/24 1946       Subjective: Seen and examined at bedside and he is resting and denied any pain to me but then his friend at bedside stated that he was telling them that he was having pain on the left side.  No nausea or vomiting.  Does not want his PEG reinserted.  No other concerns or complaints at this time and sodium is improving slowly and he appears calm but now is on oxygen.  Objective: Vitals:   10/21/24 1218 10/21/24 1938 10/22/24 0345 10/22/24 1347  BP: (!) 158/71 130/69 (!) 118/58 (!) 159/63  Pulse: 88 96 88 84  Resp: 16 20 20 16   Temp: 97.7 F (36.5 C) (!) 97.3 F (36.3 C) 98.5 F (36.9 C) (!) 96.8 F (36 C)  TempSrc:  Oral Axillary   SpO2: 100% 96% 93% 97%    Intake/Output Summary (Last 24 hours) at 10/22/2024 1758 Last data filed at 10/22/2024  0600 Gross per 24 hour  Intake 702.97 ml  Output 300 ml  Net 402.97 ml   There were no vitals filed for this visit.  Examination: Physical Exam:  Constitutional: Thin and extremely frail cachectic elderly Caucasian male who is resting Respiratory: Diminished to auscultation bilaterally with some coarse breath sounds and has some rhonchi and some slight crackles but no appreciable wheezing or rales. Normal respiratory effort and patient is not tachypenic. No accessory muscle use.  Wearing supplemental oxygen nasal cannula Cardiovascular: RRR, no murmurs / rubs / gallops. S1 and S2 auscultated. No extremity edema.  Abdomen: Soft, non-tender, non-distended.  Has a scar where his PEG tube was. Bowel sounds positive.  GU: Deferred. Musculoskeletal: No clubbing / cyanosis of digits/nails. No joint deformity upper and lower extremities.  Skin: No rashes, lesions, ulcers on a limited skin evaluation. No induration; Warm and dry.  Neurologic: CN 2-12 grossly intact with no focal deficits but is a little hard of hearing. Romberg sign and cerebellar reflexes not assessed.  Psychiatric: Is awake and alert appears calm  Data Reviewed: I have personally reviewed following labs and imaging studies  CBC: Recent Labs  Lab 10/20/24 1821 10/21/24 0449 10/22/24 0910  WBC 19.7* 12.2* 11.6*  NEUTROABS 17.8*  --  10.0*  HGB 13.8 12.4* 12.6*  HCT 45.0 38.9* 39.1  MCV 100.2* 97.7 95.6  PLT 140* 78* 67*   Basic Metabolic Panel: Recent Labs  Lab 10/20/24 1821 10/21/24 0449 10/21/24 2031 10/22/24 0910  NA 164* 160* 158* 155*  K 4.4 4.3 4.4 3.9  CL 118* 122* 119* 120*  CO2 22 22 27 24   GLUCOSE 128* 111* 82 141*  BUN 110* 88* 74* 74*  CREATININE 3.33* 2.68* 1.99* 1.93*  CALCIUM  10.1 9.0 9.4 9.0  MG  --  3.3*  --  2.9*  PHOS  --  4.9*  --  3.7   GFR: CrCl cannot be calculated (Unknown ideal weight.). Liver Function Tests: Recent Labs  Lab 10/20/24 1821 10/21/24 0449 10/22/24 0910  AST  81* 112* 110*  ALT 33 30 38  ALKPHOS 89 72 71  BILITOT 1.0 0.9 0.8  PROT 8.7* 7.1 6.8  ALBUMIN 4.0 3.2* 3.0*   No results for input(s): LIPASE, AMYLASE in the last 168 hours. No results for input(s): AMMONIA in the last 168 hours. Coagulation Profile: Recent Labs  Lab 10/21/24 0449  INR 1.1   Cardiac Enzymes: No results for input(s): CKTOTAL, CKMB, CKMBINDEX, TROPONINI in the last 168 hours. BNP (last 3 results) No results for input(s): PROBNP in the last 8760 hours. HbA1C: No results for input(s): HGBA1C in the last 72 hours. CBG: No results for input(s): GLUCAP in the last 168 hours. Lipid Profile: No results for input(s): CHOL, HDL, LDLCALC, TRIG, CHOLHDL, LDLDIRECT in the last 72 hours. Thyroid Function Tests: No results for input(s): TSH, T4TOTAL, FREET4, T3FREE, THYROIDAB in the last 72 hours. Anemia Panel: Recent Labs    10/22/24 0910 10/22/24 1203  FOLATE  --  10.2  FERRITIN 510*  --   TIBC 179*  --   IRON 21*  --   RETICCTPCT 1.1  --    Sepsis Labs: Recent Labs  Lab 10/20/24 1820 10/20/24 2243  LATICACIDVEN 3.5* 1.1   Recent Results (from the past 240 hours)  Blood Culture (routine x 2)     Status: None (Preliminary result)   Collection Time: 10/20/24  6:14 PM   Specimen: BLOOD RIGHT ARM  Result Value Ref Range Status   Specimen Description   Final    BLOOD RIGHT ARM Performed at [facility], [address] [place], [place] [address]    Special Requests   Final    BOTTLES DRAWN AEROBIC AND ANAEROBIC Blood Culture results may not be optimal due to an inadequate volume of blood received in culture bottles Performed at [facility], [address], [place], [place] [address]    Culture   Final    NO GROWTH 2 DAYS Performed at [facility], [address] [place], [place] [address]    Report Status PENDING  Incomplete  Blood Culture (routine x 2)     Status:  None (Preliminary result)   Collection Time: 10/20/24  6:17 PM   Specimen: BLOOD LEFT FOREARM  Result Value Ref Range Status   Specimen Description   Final    BLOOD LEFT FOREARM Performed at [facility], [address], [place], [place] [address]    Special Requests   Final    BOTTLES DRAWN AEROBIC AND ANAEROBIC Blood Culture results may not be optimal due to an inadequate volume of blood received in culture bottles Performed at [facility], [address], [place], [place] [address]    Culture   Final    NO GROWTH 2 DAYS Performed at [facility], [address], [place], [place] [address]    Report Status PENDING  Incomplete  Resp panel by RT-PCR (RSV, Flu A&B, Covid) Anterior Nasal Swab     Status: Abnormal   Collection Time: 10/20/24  6:34 PM   Specimen: Anterior Nasal Swab  Result Value Ref Range Status   SARS Coronavirus 2 by RT PCR POSITIVE (A) NEGATIVE Final    Comment: (NOTE) SARS-CoV-2 target nucleic acids are DETECTED.  The SARS-CoV-2 RNA is generally detectable in upper respiratory specimens during the acute phase of infection. Positive results are indicative of the presence of the identified virus, but do not rule out bacterial infection or co-infection with other  pathogens not detected by the test. Clinical correlation with patient history and other diagnostic information is necessary to determine patient infection status. The expected result is Negative.  Fact Sheet for Patients: bloggercourse.com  Fact Sheet for Healthcare Providers: seriousbroker.it  This test is not yet approved or cleared by the [place]  FDA and  has been authorized for detection and/or diagnosis of SARS-CoV-2 by FDA under an Emergency Use Authorization (EUA).  This EUA Bruce remain in effect (meaning this test can be used) for the duration of  the COVID-19 declaration under Section  564(b)(1) of the A ct, 21 U.S.C. section 360bbb-3(b)(1), unless the authorization is terminated or revoked sooner.     Influenza A by PCR NEGATIVE NEGATIVE Final   Influenza B by PCR NEGATIVE NEGATIVE Final    Comment: (NOTE) The Xpert Xpress SARS-CoV-2/FLU/RSV plus assay is intended as an aid in the diagnosis of influenza from Nasopharyngeal swab specimens and should not be used as a sole basis for treatment. Nasal washings and aspirates are unacceptable for Xpert Xpress SARS-CoV-2/FLU/RSV testing.  Fact Sheet for Patients: bloggercourse.com  Fact Sheet for Healthcare Providers: seriousbroker.it  This test is not yet approved or cleared by the [place]  FDA and has been authorized for detection and/or diagnosis of SARS-CoV-2 by FDA under an Emergency Use Authorization (EUA). This EUA Bruce remain in effect (meaning this test can be used) for the duration of the COVID-19 declaration under Section 564(b)(1) of the Act, 21 U.S.C. section 360bbb-3(b)(1), unless the authorization is terminated or revoked.     Resp Syncytial Virus by PCR NEGATIVE NEGATIVE Final    Comment: (NOTE) Fact Sheet for Patients: bloggercourse.com  Fact Sheet for Healthcare Providers: seriousbroker.it  This test is not yet approved or cleared by the [place]  FDA and has been authorized for detection and/or diagnosis of SARS-CoV-2 by FDA under an Emergency Use Authorization (EUA). This EUA Bruce remain in effect (meaning this test can be used) for the duration of the COVID-19 declaration under Section 564(b)(1) of the Act, 21 U.S.C. section 360bbb-3(b)(1), unless the authorization is terminated or revoked.  Performed at [facility], [address], [place], [place] [address]     Radiology Studies: DG CHEST PORT 1 VIEW Result Date: 10/22/2024 CLINICAL DATA:  Shortness of  breath.  COVID. EXAM: PORTABLE CHEST 1 VIEW COMPARISON:  10/20/2024 FINDINGS: Patchy opacity persists at the right lung base, with increasing patchy opacity throughout both mid lung zones in the left lung base. Stable heart size and mediastinal contours. Aortic atherosclerosis. No pneumothorax or large pleural effusion. IMPRESSION: Increasing bilateral lung opacities. Electronically Signed   By: Andrea Gasman M.D.   On: 10/22/2024 10:50   DG Chest Port 1 View Result Date: 10/20/2024 EXAM: 1 VIEW(S) XRAY OF THE CHEST 10/20/2024 06:58:00 PM COMPARISON: None available. CLINICAL HISTORY: Questionable sepsis - evaluate for abnormality FINDINGS: LUNGS AND PLEURA: The lungs are symmetrically hyperinflated in keeping with changes of underlying COPD. Patchy right lower lobe airspace opacity, suspicious for pneumonia. No pleural effusion. No pneumothorax. HEART AND MEDIASTINUM: Thoracic aortic atherosclerosis. BONES AND SOFT TISSUES: No acute osseous abnormality. IMPRESSION: 1. Patchy right lower lobe airspace opacity, suspicious for pneumonia. 2. Symmetrically hyperinflated lungs, consistent with underlying COPD. Electronically signed by: Dorethia Molt MD 10/20/2024 08:37 PM EST RP Workstation: [id number]   Scheduled Meds:  folic acid   1 mg Intravenous Daily   methylPREDNISolone  (SOLU-MEDROL ) injection  0.5 mg/kg Intravenous Q12H   Followed by   NOREEN ON 10/25/2024] predniSONE   50 mg Oral Daily   sodium chloride  flush  3 mL Intravenous Q12H   thiamine  (VITAMIN B1) injection  100 mg Intravenous Daily   Continuous Infusions:  dextrose  75 mL/hr at 10/21/24 2037    LOS: 2 days   Alejandro Marker, DO [facility] Available via Epic secure chat 7am-7pm After these hours, please refer to coverage provider listed on [email or web address] 10/22/2024, 5:58 PM  "

## 2024-10-22 NOTE — Progress Notes (Signed)
 PT Cancellation Note  Patient Details Name: Bruce Daniel MRN: [id number] DOB: Jun 10, 1941   Cancelled Treatment:    Reason Eval/Treat Not Completed: PT screened, no needs identified, will sign off. Family politely declines rehab services. Will sign off at family's request.    [name], PT Acute Rehabilitation  Office: [phone]
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
## 2024-10-22 NOTE — Plan of Care (Signed)
" °  Problem: Education: Goal: Knowledge of risk factors and measures for prevention of condition will improve Outcome: Progressing   Problem: Coping: Goal: Psychosocial and spiritual needs will be supported Outcome: Progressing   Problem: Respiratory: Goal: Will maintain a patent airway Outcome: Progressing Goal: Complications related to the disease process, condition or treatment will be avoided or minimized Outcome: Progressing   Problem: Clinical Measurements: Goal: Ability to maintain clinical measurements within normal limits will improve Outcome: Progressing   Problem: Safety: Goal: Ability to remain free from injury will improve Outcome: Progressing   Problem: Skin Integrity: Goal: Risk for impaired skin integrity will decrease Outcome: Progressing   "

## 2024-10-23 ENCOUNTER — Inpatient Hospital Stay (HOSPITAL_COMMUNITY)

## 2024-10-23 LAB — CBC WITH DIFFERENTIAL/PLATELET
Abs Immature Granulocytes: 0.1 K/uL — ABNORMAL HIGH (ref 0.00–0.07)
Basophils Absolute: 0 K/uL (ref 0.0–0.1)
Basophils Relative: 0 %
Eosinophils Absolute: 0 K/uL (ref 0.0–0.5)
Eosinophils Relative: 0 %
HCT: 36.7 % — ABNORMAL LOW (ref 39.0–52.0)
Hemoglobin: 11.8 g/dL — ABNORMAL LOW (ref 13.0–17.0)
Immature Granulocytes: 1 %
Lymphocytes Relative: 5 %
Lymphs Abs: 0.5 K/uL — ABNORMAL LOW (ref 0.7–4.0)
MCH: 30.7 pg (ref 26.0–34.0)
MCHC: 32.2 g/dL (ref 30.0–36.0)
MCV: 95.6 fL (ref 80.0–100.0)
Monocytes Absolute: 0.3 K/uL (ref 0.1–1.0)
Monocytes Relative: 3 %
Neutro Abs: 10.2 K/uL — ABNORMAL HIGH (ref 1.7–7.7)
Neutrophils Relative %: 91 %
Platelets: 56 K/uL — ABNORMAL LOW (ref 150–400)
RBC: 3.84 MIL/uL — ABNORMAL LOW (ref 4.22–5.81)
RDW: 16.8 % — ABNORMAL HIGH (ref 11.5–15.5)
Smear Review: NORMAL
WBC: 11.2 K/uL — ABNORMAL HIGH (ref 4.0–10.5)
nRBC: 0 % (ref 0.0–0.2)

## 2024-10-23 LAB — COMPREHENSIVE METABOLIC PANEL WITH GFR
ALT: 34 U/L (ref 0–44)
AST: 59 U/L — ABNORMAL HIGH (ref 15–41)
Albumin: 2.8 g/dL — ABNORMAL LOW (ref 3.5–5.0)
Alkaline Phosphatase: 66 U/L (ref 38–126)
Anion gap: 9 (ref 5–15)
BUN: 67 mg/dL — ABNORMAL HIGH (ref 8–23)
CO2: 25 mmol/L (ref 22–32)
Calcium: 8.7 mg/dL — ABNORMAL LOW (ref 8.9–10.3)
Chloride: 116 mmol/L — ABNORMAL HIGH (ref 98–111)
Creatinine, Ser: 1.67 mg/dL — ABNORMAL HIGH (ref 0.61–1.24)
GFR, Estimated: 40 mL/min — ABNORMAL LOW
Glucose, Bld: 214 mg/dL — ABNORMAL HIGH (ref 70–99)
Potassium: 3.3 mmol/L — ABNORMAL LOW (ref 3.5–5.1)
Sodium: 150 mmol/L — ABNORMAL HIGH (ref 135–145)
Total Bilirubin: 0.7 mg/dL (ref 0.0–1.2)
Total Protein: 6.2 g/dL — ABNORMAL LOW (ref 6.5–8.1)

## 2024-10-23 LAB — SEDIMENTATION RATE: Sed Rate: 65 mm/h — ABNORMAL HIGH (ref 0–16)

## 2024-10-23 LAB — MAGNESIUM: Magnesium: 2.6 mg/dL — ABNORMAL HIGH (ref 1.7–2.4)

## 2024-10-23 LAB — FERRITIN: Ferritin: 429 ng/mL — ABNORMAL HIGH (ref 24–336)

## 2024-10-23 LAB — LACTATE DEHYDROGENASE: LDH: 369 U/L — ABNORMAL HIGH (ref 105–235)

## 2024-10-23 LAB — VITAMIN B12: Vitamin B-12: 1234 pg/mL — ABNORMAL HIGH (ref 180–914)

## 2024-10-23 LAB — PHOSPHORUS: Phosphorus: 3.7 mg/dL (ref 2.5–4.6)

## 2024-10-23 LAB — C-REACTIVE PROTEIN: CRP: 21.1 mg/dL — ABNORMAL HIGH

## 2024-10-23 LAB — FIBRINOGEN: Fibrinogen: 499 mg/dL — ABNORMAL HIGH (ref 210–475)
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
## 2024-10-23 NOTE — Progress Notes (Signed)
 OT Cancellation Note  Patient Details Name: Bruce Daniel MRN: [id number] DOB: [birth year]   Cancelled Treatment:    Reason Eval/Treat Not Completed: OT screened, no needs identified, will sign off Per chart, poor prognosis and plan for residential hospice. No need for OT eval at this time.   Mliss Fish 10/23/2024, 11:55 AM

## 2024-10-23 NOTE — Progress Notes (Signed)
 " PROGRESS NOTE    Bruce Daniel  FMW:[id number] DOB: Jul 15, 1941 DOA: 10/20/2024 PCP: Bruce Handing, MD   Brief Narrative:  HPI per Dr. Will Bernard Jabari Daniel is a 83 y.o. 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  His initial diagnosis was in 2013.  He underwent radiation at that time.  He received radiation from September 23 to November 13.  The patient was hospitalized from October 22 through 29.  He was discharged from the hospital to the nursing home for rehab.  He remained in the nursing home until approximately 2 weeks ago.  The patient was able to walk with a walker at the time of discharge.  The patient's longtime girlfriend is his main caregiver.  She reports that the patient's PEG tube came out about 3 days ago but he refused to go to the ER or seek any care until today.  The patient fell from the commode today and hit the base of his neck on the metal bar across his wheelchair.  He was difficult to arouse after that fall.  Someone was able to put him in a recliner and his caregiver called 911 but even after they arrived he was taken away barely responding.  She says when she got to the ER he was a little bit more responsive and has woken up more since that time.  The patient was also found to be COVID-positive in the emergency department. No sob or cough or fever. He receives much of his care through the [facility]. The patient is being admitted for severe dehydration and severe hypernatremia.  He currently has no way to receive nutrition or hydration other than IV.  He Bruce be admitted by the medical team with IV fluids overnight but palliative care Bruce be consulted for goals of care discussion.  The patient's son who is his healthcare power of attorney is coming in from out of town and Bruce be here tomorrow.  So CODE STATUS and decisions about whether or not to consider another feeding tube Bruce need to be decided. Even with the PEG tube  the patient's significant other tells me that he often Bruce refuse the liquid nutrition that when in the PEG tube.  He also still would try to eat by mouth at times.   **Interim History Na+ is slowly improving. PEG has been removed and site has scabbed over and patient does not want it replaced. Inflammatory Markers are elevated and CXR shows worsening so Bruce start Steroids. Palliative following for GOC Discussion and current plan is to continue IV fluids and current mode of care and pursue inpatient hospice care.  He has been faxed out to the [facility] and Bruce pursue we can place if not possible for the [facility] in [place].   Assessment and Plan:  HyperNatremia: In the setting of Dehydration; He has no way to receive oral nutrition - IVF expired but Bruce resume w/ D5W at 75 mL/hr. Family and patient DO NOT want another PEG. Na+ Trend Improving:  Recent Labs  Lab 10/20/24 1821 10/21/24 0449 10/21/24 2031 10/22/24 0910 10/23/24 0733  NA 164* 160* 158* 155* 150*  -After further goals of care discussion the patient and the family have made it clear that he does not want a PEG tube.  The current plan is to continue IV fluids and current mode of care for now and have no temporary feedings.  The family is considering residential hospice with her first choice being  the [facility] in [place] or we can place if that is not possible.  The [facility] team has faxed the patient out for hospice screening and family is agreeable to pursue inpatient hospice care  Dehydration: Resume IVF as above w/ D5W and Bruce continue now until transitioned to Hospice  Abnormal LFTs: in the setting of Dehydration. AST went from 81 -> 112 -> 110 -> 59. CTM & Trend and repeat CMP in the AM  Recurrent Laryngeal Cancer: Palliative care consult for goals of care discussion. Son to arrive at some time and he is Now DNR.Bruce Daniel [name]  hospice appropriate and Bruce need continued goals of care discussion given that he does not want his PEG tube replaced.  He remains DNR   Severe Aortic Stenosis: per records - girlfriend tells me he was felt to be too ill to have the valve replacement procedure.   Essential HTN: the patient has no means for receiving routine medication as PEG is out.  Bruce monitor for now and use PRN's if necessary.  Continue monitor blood pressure per protocol last blood pressure reading was 159/63   COVID Pneumonia: is an incidental finding and intiitally was not requiring oxygen but now on O2.  NO fevers or sob. It is unclear how many days he has been sick.  He did start to feel worse 2 days ago, but that is after his PEG tube came out and he has been without any hydration or nutrition since that time. Check Inflammatory Markers: Recent Labs    10/22/24 0910 10/23/24 0733  DDIMER 2.62*  --   FERRITIN 510* 429*  LDH 467* 369*  CRP 22.3* 21.1*  Patient's ESR went from 67 -> 65 and fibrinogen  went from 574 -> 499 Lab Results  Component Value Date   Bruce Daniel POSITIVE (A) 10/20/2024   Bruce Daniel Not Detected 06/14/2019  -Getting Remdesivir . Started him on Steroids w/ 0.5 mg/kg BID x3 Days and then Prednisone . give how high his Inflammatory Markers are -Repeat CXR In the AM; CXR yesterday showed Patchy opacity persists at the right lung base, with increasing patchy opacity throughout both mid lung zones in the left lung base. Stable heart size and mediastinal contours. Aortic atherosclerosis. No pneumothorax or large pleural effusion. -Repeat CXR this AM showed Stable bibasilar patchy opacities.    AKI: BUN/Cr Trend: Recent Labs  Lab 10/20/24 1821 10/21/24 0449 10/21/24 2031 10/22/24 0910 10/23/24 0733  BUN 110* 88* 74* 74* 67*  CREATININE 3.33* 2.68* 1.99* 1.93* 1.67*  -Resume IVF as above and Bruce continue -Avoid Nephrotoxic Medications, Contrast Dyes, Hypotension and Dehydration to Ensure  Adequate Renal Perfusion and Bruce need to Renally Adjust Meds. CTM & Trend Renal Fxn carefully & Bruce repeat CMP in the AM  Normocytic Anemia: Hgb/Hct Trend:  Recent Labs  Lab 10/20/24 1821 10/21/24 0449 10/22/24 0910 10/23/24 0733  HGB 13.8 12.4* 12.6* 11.8*  HCT 45.0 38.9* 39.1 36.7*  MCV 100.2* 97.7 95.6 95.6  -Checked Anemia Panel and showed an iron level of 21, UIBC of 158, TIBC 179, saturation show 12%, ferritin 510, folate level 10.2 and Bruce check vitamin B12 level. CTM for S/Sx of Bleeding; No overt bleeding noted. Repeat CBC in the AM  Thrombocytopenia: Plt Count went from 140 -> 78 -> 67 -> 56. CTM for S/Sx of Bleeding; No overt bleeding noted. Discontinue Hepatin 5,000 units sq q8h. Repeat CBC  in th AM   Hypoalbuminemia: Patient's Albumin Lvl went from 4.0 -> 3.2 -> 3.0 -> 2.8. CTM & Trend & Repeat CMP in the AM   DVT prophylaxis: SCDs    Code Status: Limited: Do not attempt resuscitation (DNR) -DNR-LIMITED -Do Not Intubate/DNI  Family Communication: D/w his significant other at bedside  Disposition Plan:  Level of care: Med-Surg Status is: Inpatient Remains inpatient appropriate because: Needs further clinical improvement and pursuing hospice care   Consultants:  Palliative care medicine  Procedures:  As delineated as above  Antimicrobials:  Anti-infectives (From admission, onward)    Start     Dose/Rate Route Frequency Ordered Stop   10/21/24 1000  remdesivir  100 mg in sodium chloride  0.9 % 100 mL IVPB       Placed in Followed by Linked Group   100 mg 200 mL/hr over 30 Minutes Intravenous Daily 10/20/24 2302 10/22/24 1115   10/21/24 0000  remdesivir  200 mg in sodium chloride  0.9% 250 mL IVPB       Placed in Followed by Linked Group   200 mg 580 mL/hr over 30 Minutes Intravenous Once 10/20/24 2302 10/21/24 0231   10/20/24 1800  ceFEPIme  (MAXIPIME ) 2 g in sodium chloride  0.9 % 100 mL IVPB        2 g 200 mL/hr over 30 Minutes Intravenous  Once 10/20/24  1754 10/20/24 1850   10/20/24 1800  vancomycin  (VANCOCIN ) IVPB 1000 mg/200 mL premix        1,000 mg 200 mL/hr over 60 Minutes Intravenous  Once 10/20/24 1754 10/20/24 1946       Subjective: Seen and examined at bedside denying pain at this time.  Significant other states that he has been a little short of breath.  No nausea or vomiting.  No other concerns or complaints at this time.  Objective: Vitals:   10/22/24 1814 10/22/24 1924 10/23/24 0349 10/23/24 1333  BP:  (!) 146/68 139/81 138/67  Pulse:  (!) 104 78 83  Resp:  20 20   Temp:  98.1 F (36.7 C) 97.9 F (36.6 C) 97.7 F (36.5 C)  TempSrc:  Axillary Axillary Oral  SpO2:  92% 95% 95%  Weight: 43.6 kg       Intake/Output Summary (Last 24 hours) at 10/23/2024 8084 Last data filed at 10/23/2024 1800 Gross per 24 hour  Intake 1709.62 ml  Output 900 ml  Net 809.62 ml   Filed Weights   10/22/24 1814  Weight: 43.6 kg   Examination: Physical Exam:  Constitutional: Thin extremely frail cachectic elderly Caucasian male who is resting Respiratory: Diminished to auscultation bilaterally with some coarse breath sounds and has some rhonchi and slight crackles but no peripheral wheezing or rales.  Has a normal respiratory effort is not tachypneic or using accessory muscles to breathe but he is wearing supplemental oxygen nasal cannula at 2.5 liters. Cardiovascular: RRR, no murmurs / rubs / gallops. S1 and S2 auscultated. No extremity edema. Abdomen: Soft, non-tender, non-distended.  Has a scar where his PEG tube was.  Bowel sounds positive.  GU: Deferred. Musculoskeletal: No clubbing / cyanosis of digits/nails. No joint deformity upper and lower extremities.  Skin: No rashes, lesions, ulcers on the limited skin evaluation. No induration; Warm and dry.  Neurologic: CN 2-12 grossly intact with no focal deficits but has a little hard of hearing. Romberg sign and cerebellar reflexes not assessed.  Psychiatric: He is awake and alert  and calm  Data Reviewed: I have personally reviewed following labs  and imaging studies  CBC: Recent Labs  Lab 10/20/24 1821 10/21/24 0449 10/22/24 0910 10/23/24 0733  WBC 19.7* 12.2* 11.6* 11.2*  NEUTROABS 17.8*  --  10.0* 10.2*  HGB 13.8 12.4* 12.6* 11.8*  HCT 45.0 38.9* 39.1 36.7*  MCV 100.2* 97.7 95.6 95.6  PLT 140* 78* 67* 56*   Basic Metabolic Panel: Recent Labs  Lab 10/20/24 1821 10/21/24 0449 10/21/24 2031 10/22/24 0910 10/23/24 0733  NA 164* 160* 158* 155* 150*  K 4.4 4.3 4.4 3.9 3.3*  CL 118* 122* 119* 120* 116*  CO2 22 22 27 24 25   GLUCOSE 128* 111* 82 141* 214*  BUN 110* 88* 74* 74* 67*  CREATININE 3.33* 2.68* 1.99* 1.93* 1.67*  CALCIUM  10.1 9.0 9.4 9.0 8.7*  MG  --  3.3*  --  2.9* 2.6*  PHOS  --  4.9*  --  3.7 3.7   GFR: CrCl cannot be calculated (Unknown ideal weight.). Liver Function Tests: Recent Labs  Lab 10/20/24 1821 10/21/24 0449 10/22/24 0910 10/23/24 0733  AST 81* 112* 110* 59*  ALT 33 30 38 34  ALKPHOS 89 72 71 66  BILITOT 1.0 0.9 0.8 0.7  PROT 8.7* 7.1 6.8 6.2*  ALBUMIN 4.0 3.2* 3.0* 2.8*   No results for input(s): LIPASE, AMYLASE in the last 168 hours. No results for input(s): AMMONIA in the last 168 hours. Coagulation Profile: Recent Labs  Lab 10/21/24 0449  INR 1.1   Cardiac Enzymes: No results for input(s): CKTOTAL, CKMB, CKMBINDEX, TROPONINI in the last 168 hours. BNP (last 3 results) No results for input(s): PROBNP in the last 8760 hours. HbA1C: No results for input(s): HGBA1C in the last 72 hours. CBG: No results for input(s): GLUCAP in the last 168 hours. Lipid Profile: No results for input(s): CHOL, HDL, LDLCALC, TRIG, CHOLHDL, LDLDIRECT in the last 72 hours. Thyroid Function Tests: No results for input(s): TSH, T4TOTAL, FREET4, T3FREE, THYROIDAB in the last 72 hours. Anemia Panel: Recent Labs    10/22/24 0910 10/22/24 1203 10/23/24 0733  VITAMINB12  --   --  1,234*   FOLATE  --  10.2  --   FERRITIN 510*  --  429*  TIBC 179*  --   --   IRON 21*  --   --   RETICCTPCT 1.1  --   --    Sepsis Labs: Recent Labs  Lab 10/20/24 1820 10/20/24 2243  LATICACIDVEN 3.5* 1.1   Recent Results (from the past 240 hours)  Blood Culture (routine x 2)     Status: None (Preliminary result)   Collection Time: 10/20/24  6:14 PM   Specimen: BLOOD RIGHT ARM  Result Value Ref Range Status   Specimen Description   Final    BLOOD RIGHT ARM Performed at [facility], [address] [place], [place] [address]    Special Requests   Final    BOTTLES DRAWN AEROBIC AND ANAEROBIC Blood Culture results may not be optimal due to an inadequate volume of blood received in culture bottles Performed at [facility], [address], [place], [place] [address]    Culture   Final    NO GROWTH 3 DAYS Performed at [facility], [address], [place], [place] [address]    Report Status PENDING  Incomplete  Blood Culture (routine x 2)     Status: None (Preliminary result)   Collection Time: 10/20/24  6:17 PM   Specimen: BLOOD LEFT FOREARM  Result Value Ref Range  Status   Specimen Description   Final    BLOOD LEFT FOREARM Performed at [facility], [address] [place], [place] [address]    Special Requests   Final    BOTTLES DRAWN AEROBIC AND ANAEROBIC Blood Culture results may not be optimal due to an inadequate volume of blood received in culture bottles Performed at [facility] [place], [address], [place], [place] [address]    Culture   Final    NO GROWTH 3 DAYS Performed at [facility], [address] [place], [place] [address]    Report Status PENDING  Incomplete  Resp panel by RT-PCR (RSV, Flu A&B, Covid) Anterior Nasal Swab     Status: Abnormal   Collection Time: 10/20/24  6:34 PM   Specimen: Anterior Nasal Swab  Result Value Ref Range Status   SARS Coronavirus 2 by RT PCR  POSITIVE (A) NEGATIVE Final    Comment: (NOTE) SARS-CoV-2 target nucleic acids are DETECTED.  The SARS-CoV-2 RNA is generally detectable in upper respiratory specimens during the acute phase of infection. Positive results are indicative of the presence of the identified virus, but do not rule out bacterial infection or co-infection with other pathogens not detected by the test. Clinical correlation with patient history and other diagnostic information is necessary to determine patient infection status. The expected result is Negative.  Fact Sheet for Patients: bloggercourse.com  Fact Sheet for Healthcare Providers: seriousbroker.it  This test is not yet approved or cleared by the [place]  FDA and  has been authorized for detection and/or diagnosis of SARS-CoV-2 by FDA under an Emergency Use Authorization (EUA).  This EUA Bruce remain in effect (meaning this test can be used) for the duration of  the COVID-19 declaration under Section 564(b)(1) of the A ct, 21 U.S.C. section 360bbb-3(b)(1), unless the authorization is terminated or revoked sooner.     Influenza A by PCR NEGATIVE NEGATIVE Final   Influenza B by PCR NEGATIVE NEGATIVE Final    Comment: (NOTE) The Xpert Xpress SARS-CoV-2/FLU/RSV plus assay is intended as an aid in the diagnosis of influenza from Nasopharyngeal swab specimens and should not be used as a sole basis for treatment. Nasal washings and aspirates are unacceptable for Xpert Xpress SARS-CoV-2/FLU/RSV testing.  Fact Sheet for Patients: bloggercourse.com  Fact Sheet for Healthcare Providers: seriousbroker.it  This test is not yet approved or cleared by the [place]  FDA and has been authorized for detection and/or diagnosis of SARS-CoV-2 by FDA under an Emergency Use Authorization (EUA). This EUA Bruce remain in effect (meaning this test can be used)  for the duration of the COVID-19 declaration under Section 564(b)(1) of the Act, 21 U.S.C. section 360bbb-3(b)(1), unless the authorization is terminated or revoked.     Resp Syncytial Virus by PCR NEGATIVE NEGATIVE Final    Comment: (NOTE) Fact Sheet for Patients: bloggercourse.com  Fact Sheet for Healthcare Providers: seriousbroker.it  This test is not yet approved or cleared by the [place]  FDA and has been authorized for detection and/or diagnosis of SARS-CoV-2 by FDA under an Emergency Use Authorization (EUA). This EUA Bruce remain in effect (meaning this test can be used) for the duration of the COVID-19 declaration under Section 564(b)(1) of the Act, 21 U.S.C. section 360bbb-3(b)(1), unless the authorization is terminated or revoked.  Performed at [facility], [address] [place], [place] [address]     Radiology Studies: DG CHEST PORT 1 VIEW Result Date: 10/23/2024 EXAM: 1 VIEW(S)  XRAY OF THE CHEST 10/23/2024 05:02:00 AM COMPARISON: 10/22/2024 CLINICAL HISTORY: SOB (shortness of breath) FINDINGS: LUNGS AND PLEURA: Stable bibasilar patchy opacities. Right upper lung calcified granuloma. No pleural effusion. No pneumothorax. HEART AND MEDIASTINUM: Atherosclerotic calcifications. No acute abnormality of the cardiac and mediastinal silhouettes. BONES AND SOFT TISSUES: No acute osseous abnormality. IMPRESSION: 1. Stable bibasilar patchy opacities. Electronically signed by: Waddell Calk MD 10/23/2024 06:53 AM EST RP Workstation: [id number]   DG CHEST PORT 1 VIEW Result Date: 10/22/2024 CLINICAL DATA:  Shortness of breath.  COVID. EXAM: PORTABLE CHEST 1 VIEW COMPARISON:  10/20/2024 FINDINGS: Patchy opacity persists at the right lung base, with increasing patchy opacity throughout both mid lung zones in the left lung base. Stable heart size and mediastinal contours. Aortic atherosclerosis. No pneumothorax or  large pleural effusion. IMPRESSION: Increasing bilateral lung opacities. Electronically Signed   By: Andrea Gasman M.D.   On: 10/22/2024 10:50   Scheduled Meds:  folic acid   1 mg Intravenous Daily   methylPREDNISolone  (SOLU-MEDROL ) injection  0.5 mg/kg Intravenous Q12H   Followed by   NOREEN ON 10/25/2024] predniSONE   50 mg Oral Daily   sodium chloride  flush  3 mL Intravenous Q12H   thiamine  (VITAMIN B1) injection  100 mg Intravenous Daily   Continuous Infusions:  dextrose  75 mL/hr at 10/23/24 9077    LOS: 3 days   Alejandro Marker, DO [facility] Available via Epic secure chat 7am-7pm After these hours, please refer to coverage provider listed on [email or web address] 10/23/2024, 7:15 PM  "

## 2024-10-23 NOTE — Plan of Care (Signed)
" °  Problem: Education: Goal: Knowledge of risk factors and measures for prevention of condition will improve Outcome: Progressing   Problem: Coping: Goal: Psychosocial and spiritual needs will be supported Outcome: Progressing   Problem: Respiratory: Goal: Will maintain a patent airway Outcome: Progressing Goal: Complications related to the disease process, condition or treatment will be avoided or minimized Outcome: Progressing   Problem: Clinical Measurements: Goal: Ability to maintain clinical measurements within normal limits will improve Outcome: Progressing Goal: Respiratory complications will improve Outcome: Progressing   Problem: Safety: Goal: Ability to remain free from injury will improve Outcome: Progressing   Problem: Skin Integrity: Goal: Risk for impaired skin integrity will decrease Outcome: Progressing   "

## 2024-10-24 ENCOUNTER — Inpatient Hospital Stay (HOSPITAL_COMMUNITY)

## 2024-10-24 DIAGNOSIS — Z7189 Other specified counseling: Secondary | ICD-10-CM

## 2024-10-24 DIAGNOSIS — U071 COVID-19: Secondary | ICD-10-CM

## 2024-10-24 DIAGNOSIS — C329 Malignant neoplasm of larynx, unspecified: Secondary | ICD-10-CM

## 2024-10-24 DIAGNOSIS — R1310 Dysphagia, unspecified: Secondary | ICD-10-CM

## 2024-10-24 LAB — CBC WITH DIFFERENTIAL/PLATELET
Abs Immature Granulocytes: 0.09 K/uL — ABNORMAL HIGH (ref 0.00–0.07)
Basophils Absolute: 0 K/uL (ref 0.0–0.1)
Basophils Relative: 0 %
Eosinophils Absolute: 0 K/uL (ref 0.0–0.5)
Eosinophils Relative: 0 %
HCT: 35 % — ABNORMAL LOW (ref 39.0–52.0)
Hemoglobin: 11.6 g/dL — ABNORMAL LOW (ref 13.0–17.0)
Immature Granulocytes: 1 %
Lymphocytes Relative: 3 %
Lymphs Abs: 0.4 K/uL — ABNORMAL LOW (ref 0.7–4.0)
MCH: 31.1 pg (ref 26.0–34.0)
MCHC: 33.1 g/dL (ref 30.0–36.0)
MCV: 93.8 fL (ref 80.0–100.0)
Monocytes Absolute: 0.4 K/uL (ref 0.1–1.0)
Monocytes Relative: 3 %
Neutro Abs: 12.7 K/uL — ABNORMAL HIGH (ref 1.7–7.7)
Neutrophils Relative %: 93 %
Platelets: 58 K/uL — ABNORMAL LOW (ref 150–400)
RBC: 3.73 MIL/uL — ABNORMAL LOW (ref 4.22–5.81)
RDW: 16.3 % — ABNORMAL HIGH (ref 11.5–15.5)
WBC: 13.6 K/uL — ABNORMAL HIGH (ref 4.0–10.5)
nRBC: 0 % (ref 0.0–0.2)

## 2024-10-24 LAB — COMPREHENSIVE METABOLIC PANEL WITH GFR
ALT: 30 U/L (ref 0–44)
AST: 44 U/L — ABNORMAL HIGH (ref 15–41)
Albumin: 2.9 g/dL — ABNORMAL LOW (ref 3.5–5.0)
Alkaline Phosphatase: 95 U/L (ref 38–126)
Anion gap: 13 (ref 5–15)
BUN: 60 mg/dL — ABNORMAL HIGH (ref 8–23)
CO2: 24 mmol/L (ref 22–32)
Calcium: 8.8 mg/dL — ABNORMAL LOW (ref 8.9–10.3)
Chloride: 111 mmol/L (ref 98–111)
Creatinine, Ser: 1.63 mg/dL — ABNORMAL HIGH (ref 0.61–1.24)
GFR, Estimated: 41 mL/min — ABNORMAL LOW
Glucose, Bld: 185 mg/dL — ABNORMAL HIGH (ref 70–99)
Potassium: 3.2 mmol/L — ABNORMAL LOW (ref 3.5–5.1)
Sodium: 147 mmol/L — ABNORMAL HIGH (ref 135–145)
Total Bilirubin: 0.8 mg/dL (ref 0.0–1.2)
Total Protein: 6.5 g/dL (ref 6.5–8.1)

## 2024-10-24 LAB — FERRITIN: Ferritin: 455 ng/mL — ABNORMAL HIGH (ref 24–336)

## 2024-10-24 LAB — LACTATE DEHYDROGENASE: LDH: 404 U/L — ABNORMAL HIGH (ref 105–235)

## 2024-10-24 LAB — SEDIMENTATION RATE: Sed Rate: 64 mm/h — ABNORMAL HIGH (ref 0–16)

## 2024-10-24 LAB — PHOSPHORUS: Phosphorus: 3.4 mg/dL (ref 2.5–4.6)

## 2024-10-24 LAB — FIBRINOGEN: Fibrinogen: 541 mg/dL — ABNORMAL HIGH (ref 210–475)

## 2024-10-24 LAB — D-DIMER, QUANTITATIVE: D-Dimer, Quant: 8.68 ug{FEU}/mL — ABNORMAL HIGH (ref 0.00–0.50)

## 2024-10-24 LAB — MAGNESIUM: Magnesium: 2.8 mg/dL — ABNORMAL HIGH (ref 1.7–2.4)

## 2024-10-24 LAB — C-REACTIVE PROTEIN: CRP: 18 mg/dL — ABNORMAL HIGH

## 2024-10-24 MED ORDER — POTASSIUM CL IN DEXTROSE 5% 20 MEQ/L IV SOLN
20.0000 meq | INTRAVENOUS | Status: DC
  Administered 2024-10-24 – 2024-10-25 (×2): 20 meq via INTRAVENOUS
  Filled 2024-10-24 (×2): qty 1000

## 2024-10-24 MED ORDER — POTASSIUM CHLORIDE 10 MEQ/100ML IV SOLN
10.0000 meq | INTRAVENOUS | Status: AC
  Administered 2024-10-24 (×2): 10 meq via INTRAVENOUS
  Filled 2024-10-24 (×2): qty 100

## 2024-10-24 MED ORDER — POTASSIUM CHLORIDE 10 MEQ/100ML IV SOLN: 10.0000 meq | INTRAVENOUS | Status: DC

## 2024-10-24 NOTE — Progress Notes (Signed)
 " PROGRESS NOTE    Bruce Daniel  FMW:[id number] DOB: Jun 15, 1941 DOA: 10/20/2024 PCP: [name], MD   Brief Narrative:  Bruce Daniel is a 83 y.o. 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 He remained in the nursing home until approximately 2 weeks ago.  The patient was able to walk with a walker at the time of discharge but approximately 3 days prior to admission his PEG tube came out and he refused to go to the ER or seek any care.  Subsequently the patient fell from the commode today and hit the base of his neck on the metal bar across his wheelchair.  He was difficult to arouse after that fall.  Someone was able to put him in a recliner and his caregiver called 911 but even after they arrived he was taken away barely responding.  Brought in and was found to have significant hyponatremia and COVID.  **Interim History Na+ is slowly improving.  Send this PEG has been removed and site has scabbed over and patient does not want it replaced. Inflammatory Markers are elevated due to COVID and CXR shows worsening so will start Steroids. Palliative following for GOC Discussion and current plan is to continue IV fluids and current mode of care and pursue inpatient hospice care.  He has been faxed out to the [facility] hosp[facility] and will pursue [facility] if not possible for the [facility] in [place].   Assessment and Plan:  HyperNatremia: In the setting of Dehydration; He has no way to receive oral nutrition. IVF w/ D5W at 75 mL/hr. Family and patient DO NOT want another PEG. Na+ Trend Improving and went from 164 -> 160 -> 158 -> 155 -> 150 -> 147.  -After further goals of care discussion the patient and the family have made it clear that he does not want a PEG tube.  The current plan is to continue IV fluids and current mode of care for now and have no  temporary feedings.  The family is liklely pursuing residential hospice with her first choice being the [facility] in [place] or [facility] if that is not possible.  The [facility] team has faxed the patient out for hospice screening and family is agreeable to pursue inpatient hospice care and [facility] working with the [facility] and coordinating care  Dehydration: Resume IVF as above w/ D5W and will continue now until transitioned to Residential Hospice at the [facility] or [facility]  Abnormal LFTs: in the setting of Dehydration. AST went from 81 -> 112 -> 110 -> 59 -> 44. CTM & Trend and repeat CMP in the AM  Recurrent Laryngeal Cancer: Palliative care consult for goals of care discussion. Son to arrive at some time and he is Now DNR.SABRA [name] hospice appropriate and will need continued goals of care discussion given that he does not want his PEG tube replaced.  He remains DNR   Severe Aortic Stenosis: per records - S/o tells me he was felt to be too ill to have the valve replacement procedure.   Essential HTN: the patient has no means for receiving routine medication as PEG is out.  Will monitor for now and use PRN's if necessary.  Continue monitor blood pressure per protocol last blood pressure reading was 154/59  Hypokalemia: K+ is 3.2. Replete w/ IV Kcl 40 mEQ. CTM & Replete as Necessary.    COVID Pneumonia: is an incidental finding and intiitally was not requiring oxygen but now on O2.  NO fevers or sob. It is unclear how many days he has been sick.  He did start to feel worse 2 days ago, but that is after his PEG tube came out and he has been without any hydration or nutrition since that time. Check Inflammatory Markers: Recent Labs    10/22/24 0910 10/23/24 0733 10/24/24 0448  DDIMER 2.62*  --  8.68*  FERRITIN 510* 429* 455*  LDH 467* 369* 404*  CRP 22.3* 21.1* 18.0*  Patient's ESR went from 67 -> 65  -> 65 and Fibrinogen  went from 574 -> 499 -> 541 Lab Results  Component Value Date   SARSCOV2NAA POSITIVE (A) 10/20/2024   SARSCOV2NAA Not Detected 06/14/2019  -Getting Remdesivir . Started him on Steroids w/ 0.5 mg/kg BID x3 Days and then Prednisone . give how high his Inflammatory Markers are -D-Dimer went up but unfortunately not on Pharmacologic VTE Prophylaxis due to Thrombocytopenia -Repeat CXR In the AM; CXR 12/30 showed Persistent bibasilar opacities with mild improvement from yesterday.   AKI: Baseline in our system is around 1.2. BUN/Cr went from 110/3.33 -> 88/2.68 -> 74/1.99 -> 74/1.93 -> 67/1.67 -> 60/1.63. Resume IVF as above and will continue. Avoid Nephrotoxic Medications, Contrast Dyes, Hypotension and Dehydration to Ensure Adequate Renal Perfusion and will need to Renally Adjust Meds. CTM & Trend Renal Fxn carefully & will repeat CMP in the AM  Normocytic Anemia: Hgb/Hct Trend dropped in the setting of dilution. Hgb/Hct relatively stable and is now 11.6/35.0. Checked Anemia Panel and showed an iron level of 21, UIBC of 158, TIBC 179, saturation show 12%, ferritin 510, folate level 10.2 and will check vitamin B12 level. CTM for S/Sx of Bleeding; No overt bleeding noted. Repeat CBC in the AM  Thrombocytopenia: Plt Count went from 140 -> 78 -> 67 -> 56. CTM for S/Sx of Bleeding; No overt bleeding noted. Discontinue Hepatin 5,000 units sq q8h. Repeat CBC in th AM   Hypoalbuminemia: Patient's Albumin Lvl went from 4.0 -> 3.2 -> 3.0 -> 2.8. CTM & Trend & Repeat CMP in the AM   DVT prophylaxis: SCDs    Code Status: Limited: Do not attempt resuscitation (DNR) -DNR-LIMITED -Do Not Intubate/DNI  Family Communication: D/w Significant other @ bedside  Disposition Plan:  Level of care: Med-Surg Status is: Inpatient Remains inpatient appropriate because: [facility]   Consultants:  Palliative Care Medicine   Procedures:  As delineated as  above  Antimicrobials:  Anti-infectives (From admission, onward)    Start     Dose/Rate Route Frequency Ordered Stop   10/21/24 1000  remdesivir  100 mg in sodium chloride  0.9 % 100 mL IVPB       Placed in Followed by Linked Group   100 mg 200 mL/hr over 30 Minutes Intravenous Daily 10/20/24 2302 10/22/24 1115   10/21/24 0000  remdesivir  200 mg in sodium chloride  0.9% 250 mL IVPB       Placed in Followed by Linked Group   200 mg 580 mL/hr over 30 Minutes Intravenous Once 10/20/24 2302 10/21/24 0231   10/20/24 1800  ceFEPIme  (MAXIPIME ) 2 g in sodium chloride  0.9 % 100 mL IVPB        2 g 200 mL/hr over 30 Minutes Intravenous  Once 10/20/24 1754 10/20/24 1850  10/20/24 1800  vancomycin  (VANCOCIN ) IVPB 1000 mg/200 mL premix        1,000 mg 200 mL/hr over 60 Minutes Intravenous  Once 10/20/24 1754 10/20/24 1946       Subjective: Seen and examined at bedside he is resting and was complaining some pain in the hip.  No nausea or vomiting and breathing is stable.  Denied any lightheadedness.  Slept okay.  No other concerns or questions.  Objective: Vitals:   10/23/24 1333 10/23/24 2100 10/24/24 0530 10/24/24 1314  BP: 138/67 139/70 135/65 (!) 154/59  Pulse: 83 82 81 71  Resp:  20 20 17   Temp: 97.7 F (36.5 C) 97.7 F (36.5 C) 97.6 F (36.4 C) 98.4 F (36.9 C)  TempSrc: Oral Oral Oral   SpO2: 95% 100% 100% 96%  Weight:        Intake/Output Summary (Last 24 hours) at 10/24/2024 1919 Last data filed at 10/24/2024 1814 Gross per 24 hour  Intake 1139.48 ml  Output 1050 ml  Net 89.48 ml   Filed Weights   10/22/24 1814  Weight: 43.6 kg   Examination: Physical Exam:  Constitutional: Thin frail cachectic elderly Caucasian male who is resting Respiratory: Diminished to auscultation bilaterally with some coarse breath sounds and some slight rhonchi and mild crackles but no appreciable wheezing or rales.  Has a normal respiratory effort and is not tachypneic or using accessory  muscle breathe but is wearing supplemental oxygen via nasal cannula Cardiovascular: RRR, no murmurs / rubs / gallops. S1 and S2 auscultated. No extremity edema.  Abdomen: Soft, non-tender, non-distended.  PEG site has a scar now where it used to be.  Bowel sounds positive.  GU: Deferred. Musculoskeletal: No clubbing / cyanosis of digits/nails. No joint deformity upper and lower extremities.  Skin: No rashes, lesions, ulcers on the limited skin evaluation. No induration; Warm and dry.  Neurologic: CN 2-12 grossly intact with no focal deficits but is a little hard of hearing. Romberg sign and cerebellar reflexes not assessed.  Psychiatric: He is awake alert and calm  Data Reviewed: I have personally reviewed following labs and imaging studies  CBC: Recent Labs  Lab 10/20/24 1821 10/21/24 0449 10/22/24 0910 10/23/24 0733 10/24/24 0448  WBC 19.7* 12.2* 11.6* 11.2* 13.6*  NEUTROABS 17.8*  --  10.0* 10.2* 12.7*  HGB 13.8 12.4* 12.6* 11.8* 11.6*  HCT 45.0 38.9* 39.1 36.7* 35.0*  MCV 100.2* 97.7 95.6 95.6 93.8  PLT 140* 78* 67* 56* 58*   Basic Metabolic Panel: Recent Labs  Lab 10/21/24 0449 10/21/24 2031 10/22/24 0910 10/23/24 0733 10/24/24 0448  NA 160* 158* 155* 150* 147*  K 4.3 4.4 3.9 3.3* 3.2*  CL 122* 119* 120* 116* 111  CO2 22 27 24 25 24   GLUCOSE 111* 82 141* 214* 185*  BUN 88* 74* 74* 67* 60*  CREATININE 2.68* 1.99* 1.93* 1.67* 1.63*  CALCIUM  9.0 9.4 9.0 8.7* 8.8*  MG 3.3*  --  2.9* 2.6* 2.8*  PHOS 4.9*  --  3.7 3.7 3.4   GFR: CrCl cannot be calculated (Unknown ideal weight.). Liver Function Tests: Recent Labs  Lab 10/20/24 1821 10/21/24 0449 10/22/24 0910 10/23/24 0733 10/24/24 0448  AST 81* 112* 110* 59* 44*  ALT 33 30 38 34 30  ALKPHOS 89 72 71 66 95  BILITOT 1.0 0.9 0.8 0.7 0.8  PROT 8.7* 7.1 6.8 6.2* 6.5  ALBUMIN 4.0 3.2* 3.0* 2.8* 2.9*   No results for input(s): LIPASE, AMYLASE in the last 168 hours.  No results for input(s): AMMONIA in the  last 168 hours. Coagulation Profile: Recent Labs  Lab 10/21/24 0449  INR 1.1   Cardiac Enzymes: No results for input(s): CKTOTAL, CKMB, CKMBINDEX, TROPONINI in the last 168 hours. BNP (last 3 results) No results for input(s): PROBNP in the last 8760 hours. HbA1C: No results for input(s): HGBA1C in the last 72 hours. CBG: No results for input(s): GLUCAP in the last 168 hours. Lipid Profile: No results for input(s): CHOL, HDL, LDLCALC, TRIG, CHOLHDL, LDLDIRECT in the last 72 hours. Thyroid Function Tests: No results for input(s): TSH, T4TOTAL, FREET4, T3FREE, THYROIDAB in the last 72 hours. Anemia Panel: Recent Labs    10/22/24 0910 10/22/24 1203 10/23/24 0733 10/24/24 0448  VITAMINB12  --   --  1,234*  --   FOLATE  --  10.2  --   --   FERRITIN 510*  --  429* 455*  TIBC 179*  --   --   --   IRON 21*  --   --   --   RETICCTPCT 1.1  --   --   --    Sepsis Labs: Recent Labs  Lab 10/20/24 1820 10/20/24 2243  LATICACIDVEN 3.5* 1.1   Recent Results (from the past 240 hours)  Blood Culture (routine x 2)     Status: None (Preliminary result)   Collection Time: 10/20/24  6:14 PM   Specimen: BLOOD RIGHT ARM  Result Value Ref Range Status   Specimen Description   Final    BLOOD RIGHT ARM Performed at [facility], [address] [place], [place] [address]    Special Requests   Final    BOTTLES DRAWN AEROBIC AND ANAEROBIC Blood Culture results may not be optimal due to an inadequate volume of blood received in culture bottles Performed at [facility], [address] [place], [place] [address]    Culture   Final    NO GROWTH 4 DAYS Performed at [facility], [address], [place], [place] [address]    Report Status PENDING  Incomplete  Blood Culture (routine x 2)     Status: None (Preliminary result)   Collection Time: 10/20/24  6:17 PM   Specimen: BLOOD LEFT FOREARM  Result Value Ref  Range Status   Specimen Description   Final    BLOOD LEFT FOREARM Performed at [facility], [address], [place], [place] [address]    Special Requests   Final    BOTTLES DRAWN AEROBIC AND ANAEROBIC Blood Culture results may not be optimal due to an inadequate volume of blood received in culture bottles Performed at [facility], [address] [place], [place] [address]    Culture   Final    NO GROWTH 4 DAYS Performed at [facility], [address], [place], [place] [address]    Report Status PENDING  Incomplete  Resp panel by RT-PCR (RSV, Flu A&B, Covid) Anterior Nasal Swab     Status: Abnormal   Collection Time: 10/20/24  6:34 PM   Specimen: Anterior Nasal Swab  Result Value Ref Range Status   SARS Coronavirus 2 by RT PCR POSITIVE (A) NEGATIVE Final    Comment: (NOTE) SARS-CoV-2 target nucleic acids are DETECTED.  The SARS-CoV-2 RNA is generally detectable in upper respiratory specimens during the acute phase of infection. Positive results are indicative of the presence of the identified virus, but do not rule out bacterial infection or co-infection  with other pathogens not detected by the test. Clinical correlation with patient history and other diagnostic information is necessary to determine patient infection status. The expected result is Negative.  Fact Sheet for Patients: bloggercourse.com  Fact Sheet for Healthcare Providers: seriousbroker.it  This test is not yet approved or cleared by the [place]  FDA and  has been authorized for detection and/or diagnosis of SARS-CoV-2 by FDA under an Emergency Use Authorization (EUA).  This EUA will remain in effect (meaning this test can be used) for the duration of  the COVID-19 declaration under Section 564(b)(1) of the A ct, 21 U.S.C. section 360bbb-3(b)(1), unless the authorization is terminated or revoked sooner.      Influenza A by PCR NEGATIVE NEGATIVE Final   Influenza B by PCR NEGATIVE NEGATIVE Final    Comment: (NOTE) The Xpert Xpress SARS-CoV-2/FLU/RSV plus assay is intended as an aid in the diagnosis of influenza from Nasopharyngeal swab specimens and should not be used as a sole basis for treatment. Nasal washings and aspirates are unacceptable for Xpert Xpress SARS-CoV-2/FLU/RSV testing.  Fact Sheet for Patients: bloggercourse.com  Fact Sheet for Healthcare Providers: seriousbroker.it  This test is not yet approved or cleared by the [place]  FDA and has been authorized for detection and/or diagnosis of SARS-CoV-2 by FDA under an Emergency Use Authorization (EUA). This EUA will remain in effect (meaning this test can be used) for the duration of the COVID-19 declaration under Section 564(b)(1) of the Act, 21 U.S.C. section 360bbb-3(b)(1), unless the authorization is terminated or revoked.     Resp Syncytial Virus by PCR NEGATIVE NEGATIVE Final    Comment: (NOTE) Fact Sheet for Patients: bloggercourse.com  Fact Sheet for Healthcare Providers: seriousbroker.it  This test is not yet approved or cleared by the [place]  FDA and has been authorized for detection and/or diagnosis of SARS-CoV-2 by FDA under an Emergency Use Authorization (EUA). This EUA will remain in effect (meaning this test can be used) for the duration of the COVID-19 declaration under Section 564(b)(1) of the Act, 21 U.S.C. section 360bbb-3(b)(1), unless the authorization is terminated or revoked.  Performed at [facility], [address] [place], [place] [address]     Radiology Studies: DG CHEST PORT 1 VIEW Result Date: 10/24/2024 CLINICAL DATA:  Shortness of breath. EXAM: PORTABLE CHEST 1 VIEW COMPARISON:  Radiograph yesterday FINDINGS: Stable heart size and mediastinal contours.  Aortic atherosclerosis. Persistent bibasilar opacities with mild improvement. No progressive airspace disease. Calcified granuloma in the right lung. No pneumothorax or pleural effusion. No pulmonary edema. IMPRESSION: Persistent bibasilar opacities with mild improvement from yesterday. Electronically Signed   By: Andrea Gasman M.D.   On: 10/24/2024 15:49   DG CHEST PORT 1 VIEW Result Date: 10/23/2024 EXAM: 1 VIEW(S) XRAY OF THE CHEST 10/23/2024 05:02:00 AM COMPARISON: 10/22/2024 CLINICAL HISTORY: SOB (shortness of breath) FINDINGS: LUNGS AND PLEURA: Stable bibasilar patchy opacities. Right upper lung calcified granuloma. No pleural effusion. No pneumothorax. HEART AND MEDIASTINUM: Atherosclerotic calcifications. No acute abnormality of the cardiac and mediastinal silhouettes. BONES AND SOFT TISSUES: No acute osseous abnormality. IMPRESSION: 1. Stable bibasilar patchy opacities. Electronically signed by: Waddell Calk MD 10/23/2024 06:53 AM EST RP Workstation: [id number]   Scheduled Meds:  folic acid   1 mg Intravenous Daily   methylPREDNISolone  (SOLU-MEDROL ) injection  0.5 mg/kg Intravenous Q12H   Followed by   NOREEN ON 10/25/2024] predniSONE   50 mg Oral Daily   sodium chloride  flush  3 mL Intravenous Q12H   thiamine  (VITAMIN B1)  injection  100 mg Intravenous Daily   Continuous Infusions:  dextrose  75 mL/hr at 10/24/24 1353   potassium chloride       LOS: 4 days   Alejandro Marker, DO [facility] Available via Epic secure chat 7am-7pm After these hours, please refer to coverage provider listed on [email or web address] 10/24/2024, 7:19 PM  "

## 2024-10-24 NOTE — Plan of Care (Signed)
  Problem: Coping: Goal: Psychosocial and spiritual needs will be supported Outcome: Progressing   Problem: Respiratory: Goal: Will maintain a patent airway Outcome: Progressing   

## 2024-10-24 NOTE — Progress Notes (Signed)
" °  Daily Progress Note   Patient Name: Bruce Daniel       Date: 10/24/2024 DOB: 1941/08/15  Age: 83 y.o. MRN#: [id number] Attending Physician: Sherrill Alejandro Donovan, DO Primary Care Physician: [name], MD Admit Date: 10/20/2024 Length of Stay: 4 days  Reason for Consultation/Follow-up: Establishing goals of care  Subjective:   Reviewed EMR including recent documentation from hospitalist and TOC.  TOC has provided information to patient's son, Bruce Daniel, about pursuing inpatient hospice through [facility]. currently continuing IV fluids for support.  Patient does not have a source of nutrition. Patient was tested for COVID on 10/23/2024 and noted to be positive.  Discussed care with bedside RN for medical updates. Patient's main concern is soreness on his bottom from lying in the bed which staff has worked with him to alleviate by frequently rotating.  No other concerns.  Palliative medicine team continuing to follow along with patient's medical journey.  Objective:   Vital Signs:  BP 135/65 (BP Location: Right Arm)   Pulse 81   Temp 97.6 F (36.4 C) (Oral)   Resp 20   Wt 43.6 kg   SpO2 100%   Physical Exam: General: NAD, chronically ill-appearing Cardiovascular: RRR Respiratory: no increased work of breathing noted, not in respiratory distress  Assessment & Plan:   Assessment: Patient is an 83 year old male with a past medical history of recurrent laryngeal cancer, severe aortic stenosis, and hypertension who was admitted on 10/20/2024 for management of severe dehydration and severe hyponatremia.  Hospitalization medicine discussions were had and patient has stated that he does not wish to have PEG tube reinserted for nutrition.  Palliative team consulted to assist with complex medical decision making.  Recommendations/Plan: # Complex medical decision making/goals of care:  - Continuing current medical interventions.  Patient is DNR/DNI.  Family seeking evaluation for inpatient hospice  care through the [facility] since that is how patient receives most of his care.  Son assisting with coordination of this care along with TOC.  Palliative medicine team continuing to follow with patient's medical journey.  -  Code Status: Limited: Do not attempt resuscitation (DNR) -DNR-LIMITED -Do Not Intubate/DNI   # Psychosocial Support:  - Son-Bruce Daniel/HCPOA, significant other-Bruce Daniel  # Discharge Planning: [facility] and [place], [place].  [facility]  Thank you for allowing the palliative care team to participate in the care Deward Hacker.  Tinnie Radar, DO Palliative Care Provider PMT # [phone]  If patient remains symptomatic despite maximum doses, please call PMT at [phone] between 0700 and 1900. Outside of these hours, please call attending, as PMT does not have night coverage.  "

## 2024-10-24 NOTE — TOC Progression Note (Signed)
 Transition of Care [facility]) - Progression Note    Patient Details  Name: Bruce Daniel MRN: [id number] Date of Birth: Sep 17, 1941  [facility] CM/SW Contact  Doneta Glenys DASEN, RN Phone Number: 10/24/2024, 12:38 PM  Clinical Narrative:    [phone] to confirm received Hospice Checklist - left message.   Expected Discharge Plan: Hospice Medical Facility Barriers to Discharge: Continued Medical Work up               Expected Discharge Plan and Services In-house Referral: Hospice / Palliative Care Discharge Planning Services: CM Consult   Living arrangements for the past 2 months: Single Family Home                 DME Arranged: N/A DME Agency: NA       HH Arranged: NA HH Agency: NA         Social Drivers of Health (SDOH) Interventions SDOH Screenings   Food Insecurity: Patient Unable To Answer (10/21/2024)  Housing: Unknown (10/21/2024)  Transportation Needs: No Transportation Needs (10/21/2024)  Utilities: Patient Unable To Answer (10/21/2024)  Social Connections: Socially Integrated (10/21/2024)  Tobacco Use: Medium Risk (08/17/2024)   Received from [facility]    Readmission Risk Interventions    10/23/2024   10:05 AM  Readmission Risk Prevention Plan  Transportation Screening Complete  PCP or Specialist Appt within 5-7 Days Complete  Home Care Screening Complete  Medication Review (RN CM) Complete

## 2024-10-25 ENCOUNTER — Inpatient Hospital Stay (HOSPITAL_COMMUNITY)

## 2024-10-25 DIAGNOSIS — R0602 Shortness of breath: Secondary | ICD-10-CM

## 2024-10-25 DIAGNOSIS — D649 Anemia, unspecified: Secondary | ICD-10-CM | POA: Diagnosis present

## 2024-10-25 DIAGNOSIS — D696 Thrombocytopenia, unspecified: Secondary | ICD-10-CM | POA: Diagnosis present

## 2024-10-25 DIAGNOSIS — N179 Acute kidney failure, unspecified: Secondary | ICD-10-CM

## 2024-10-25 DIAGNOSIS — Z79899 Other long term (current) drug therapy: Secondary | ICD-10-CM

## 2024-10-25 DIAGNOSIS — I350 Nonrheumatic aortic (valve) stenosis: Secondary | ICD-10-CM

## 2024-10-25 DIAGNOSIS — L8990 Pressure ulcer of unspecified site, unspecified stage: Secondary | ICD-10-CM | POA: Diagnosis present

## 2024-10-25 DIAGNOSIS — Z7189 Other specified counseling: Secondary | ICD-10-CM

## 2024-10-25 DIAGNOSIS — Z711 Person with feared health complaint in whom no diagnosis is made: Secondary | ICD-10-CM

## 2024-10-25 DIAGNOSIS — Z66 Do not resuscitate: Secondary | ICD-10-CM

## 2024-10-25 LAB — LACTATE DEHYDROGENASE: LDH: 424 U/L — ABNORMAL HIGH (ref 105–235)

## 2024-10-25 LAB — CBC WITH DIFFERENTIAL/PLATELET
Abs Immature Granulocytes: 0.06 K/uL (ref 0.00–0.07)
Basophils Absolute: 0 K/uL (ref 0.0–0.1)
Basophils Relative: 0 %
Eosinophils Absolute: 0 K/uL (ref 0.0–0.5)
Eosinophils Relative: 0 %
HCT: 36.3 % — ABNORMAL LOW (ref 39.0–52.0)
Hemoglobin: 12 g/dL — ABNORMAL LOW (ref 13.0–17.0)
Immature Granulocytes: 1 %
Lymphocytes Relative: 3 %
Lymphs Abs: 0.3 K/uL — ABNORMAL LOW (ref 0.7–4.0)
MCH: 30.9 pg (ref 26.0–34.0)
MCHC: 33.1 g/dL (ref 30.0–36.0)
MCV: 93.6 fL (ref 80.0–100.0)
Monocytes Absolute: 0.6 K/uL (ref 0.1–1.0)
Monocytes Relative: 5 %
Neutro Abs: 9.9 K/uL — ABNORMAL HIGH (ref 1.7–7.7)
Neutrophils Relative %: 91 %
Platelets: 68 K/uL — ABNORMAL LOW (ref 150–400)
RBC: 3.88 MIL/uL — ABNORMAL LOW (ref 4.22–5.81)
RDW: 15.9 % — ABNORMAL HIGH (ref 11.5–15.5)
Smear Review: NORMAL
WBC: 10.9 K/uL — ABNORMAL HIGH (ref 4.0–10.5)
nRBC: 0 % (ref 0.0–0.2)

## 2024-10-25 LAB — COMPREHENSIVE METABOLIC PANEL WITH GFR
ALT: 28 U/L (ref 0–44)
AST: 34 U/L (ref 15–41)
Albumin: 2.9 g/dL — ABNORMAL LOW (ref 3.5–5.0)
Alkaline Phosphatase: 74 U/L (ref 38–126)
Anion gap: 11 (ref 5–15)
BUN: 53 mg/dL — ABNORMAL HIGH (ref 8–23)
CO2: 22 mmol/L (ref 22–32)
Calcium: 8.7 mg/dL — ABNORMAL LOW (ref 8.9–10.3)
Chloride: 108 mmol/L (ref 98–111)
Creatinine, Ser: 1.43 mg/dL — ABNORMAL HIGH (ref 0.61–1.24)
GFR, Estimated: 48 mL/min — ABNORMAL LOW
Glucose, Bld: 134 mg/dL — ABNORMAL HIGH (ref 70–99)
Potassium: 3.7 mmol/L (ref 3.5–5.1)
Sodium: 141 mmol/L (ref 135–145)
Total Bilirubin: 0.9 mg/dL (ref 0.0–1.2)
Total Protein: 6.6 g/dL (ref 6.5–8.1)

## 2024-10-25 LAB — CULTURE, BLOOD (ROUTINE X 2)
Culture: NO GROWTH
Culture: NO GROWTH

## 2024-10-25 LAB — SEDIMENTATION RATE: Sed Rate: 43 mm/h — ABNORMAL HIGH (ref 0–16)

## 2024-10-25 LAB — PHOSPHORUS: Phosphorus: 2.6 mg/dL (ref 2.5–4.6)

## 2024-10-25 LAB — C-REACTIVE PROTEIN: CRP: 11.6 mg/dL — ABNORMAL HIGH

## 2024-10-25 LAB — MAGNESIUM: Magnesium: 2.5 mg/dL — ABNORMAL HIGH (ref 1.7–2.4)

## 2024-10-25 LAB — FERRITIN: Ferritin: 462 ng/mL — ABNORMAL HIGH (ref 24–336)

## 2024-10-25 LAB — D-DIMER, QUANTITATIVE: D-Dimer, Quant: 13.49 ug{FEU}/mL — ABNORMAL HIGH (ref 0.00–0.50)

## 2024-10-25 LAB — FIBRINOGEN: Fibrinogen: 518 mg/dL — ABNORMAL HIGH (ref 210–475)

## 2024-10-25 MED ORDER — BIOTENE DRY MOUTH MT LIQD: 15.0000 mL | OROMUCOSAL | Status: DC | PRN

## 2024-10-25 MED ORDER — GLYCOPYRROLATE 0.2 MG/ML IJ SOLN: 0.2000 mg | INTRAMUSCULAR | Status: DC | PRN

## 2024-10-25 MED ORDER — HALOPERIDOL LACTATE 5 MG/ML IJ SOLN: 1.0000 mg | INTRAMUSCULAR | Status: DC | PRN

## 2024-10-25 MED ORDER — HYDROMORPHONE HCL 1 MG/ML IJ SOLN
0.5000 mg | INTRAMUSCULAR | Status: DC | PRN
  Administered 2024-10-25 – 2024-10-26 (×2): 1 mg via INTRAVENOUS
  Filled 2024-10-25 (×2): qty 1

## 2024-10-25 MED ORDER — ONDANSETRON HCL 4 MG/2ML IJ SOLN: 4.0000 mg | Freq: Four times a day (QID) | INTRAMUSCULAR | Status: DC | PRN

## 2024-10-25 MED ORDER — GLYCERIN (LAXATIVE) 2 G RE SUPP: 1.0000 | Freq: Every day | RECTAL | Status: DC | PRN

## 2024-10-25 MED ORDER — HYDROMORPHONE HCL 1 MG/ML IJ SOLN: 0.5000 mg | INTRAMUSCULAR | Status: DC | PRN

## 2024-10-25 MED ORDER — POLYVINYL ALCOHOL 1.4 % OP SOLN: 1.0000 [drp] | Freq: Four times a day (QID) | OPHTHALMIC | Status: DC | PRN

## 2024-10-25 MED ORDER — LORAZEPAM 2 MG/ML IJ SOLN: 1.0000 mg | INTRAMUSCULAR | Status: DC | PRN

## 2024-10-25 NOTE — Assessment & Plan Note (Signed)
 Palliative care consulted for goals of care discussions Given his inability to take PO and unwillingness to have another PEG tube placed, he has been transitioned to comfort care and will be discharged to residential hospice He remains DNR

## 2024-10-25 NOTE — Discharge Summary (Signed)
 " Physician Discharge Summary   Patient: Bruce Daniel MRN: [id number] DOB: [birth year]  Admit date:     10/20/2024  Discharge date: 10/25/2024  Discharge Physician: Delon Herald   PCP: [name], MD   Recommendations at discharge:    You are being discharged to residential hospice at [facility]  Discharge Diagnoses: Principal Problem:   Hypernatremia Active Problems:   HTN (hypertension)   BPH (benign prostatic hyperplasia)   Laryngeal cancer (HCC)   Severe aortic stenosis   Dysphagia   COVID-19 virus infection   Palliative care by specialist   Goals of care, counseling/discussion   Counseling and coordination of care   Pressure injury of skin   AKI (acute kidney injury)   Normocytic anemia   Thrombocytopenia    Hospital Course: 83yo with h/o 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  Following cancer diagnosis, he was discharged to nursing facility for rehabilitation and returned home approximately 2 weeks ago.  The patient was able to walk with a walker at the time of discharge but approximately 3 days prior to admission his PEG tube came out and he refused to go to the ER or seek any care.  On presentation he was found to have significant hypernatremia and COVID-19 infection.  Na++ is slowing improving.  Patient is refusing PEG replacement.  Inflammatory markers are elevated due to COVID and CXR showed worsening so he was started on steroids. Palliative following for GOC Discussion and current plan is to continue IV fluids and current mode of care and pursue inpatient hospice care.  He has been faxed out to the [place] hosp[facility] in [employer] vs. [facility].   Assessment and Plan:  Assessment & Plan Hypernatremia In the setting of dehydration He has no way to receive oral nutrition Family and patient DO NOT want another PEG Na++ has improved with IVF The family is in agreement with transition to comfort care  and residential hospice He has been accepted to [facility] Laryngeal cancer [facility]) Dysphagia Palliative care consulted for goals of care discussions Given his inability to take PO and unwillingness to have another PEG tube placed, he has been transitioned to comfort care and will be discharged to residential hospice He remains DNR COVID-19 virus infection Incidental finding  Initially, he was not requiring oxygen but now on O2 No fevers or SOB It is unclear how many days he has been sick Given Remdesivir  and steroids Transitioned to comfort care Severe aortic stenosis Not a candidate for valve replacement and so not a candidate for further treatment of his laryngeal cancer Pressure injury of skin Wound 10/21/24 0100 Pressure Injury Sacrum Mid Stage 1 -  Intact skin with non-blanchable redness of a localized area usually over a bony prominence. (Active)  POA HTN (hypertension) No home medications AKI (acute kidney injury) Baseline creatinine 1.2, increased to 3.33 on presentation Improving with IVF LIkely related to dehydration in the setting of no PEG tube and inability to tolerate PO intake Normocytic anemia Slowly decreasing since admission Transitioned to comfort care Thrombocytopenia Worsening since admission Heparin  stopped Transitioned to comfort care      Consultants: Palliative care PT OT SLP   Procedures: None   Antibiotics: Cefepime  x 1 Vancomycin  x 1 Remdesivir  x 3 doses    Pain control - [place]  Controlled Substance Reporting System database was reviewed. and patient was instructed, not to drive, operate heavy machinery, perform activities at heights, swimming or participation in  water activities or provide baby-sitting services while on Pain, Sleep and Anxiety Medications; until their outpatient Physician has advised to do so again. Also recommended to not to take more than prescribed Pain, Sleep and Anxiety Medications.   Disposition: Hospice  care Diet recommendation:  NPO   DISCHARGE MEDICATION: Allergies as of 10/25/2024       Reactions   Other Other (See Comments)   Flu Vaccine, bad feelings   Venlafaxine Hcl Nausea Only   Bactrim [sulfamethoxazole-trimethoprim] Other (See Comments)   Unable to taste   Simvastatin Other (See Comments)   Leg pain   Sulfamethoxazole Other (See Comments)   Loss of Taste after taking        Medication List     STOP taking these medications    acetaminophen  500 MG tablet Commonly known as: TYLENOL    albuterol  (5 MG/ML) 0.5% nebulizer solution Commonly known as: PROVENTIL    aspirin  EC 81 MG tablet   atenolol 50 MG tablet Commonly known as: TENORMIN   atorvastatin  80 MG tablet Commonly known as: LIPITOR   docusate sodium  100 MG capsule Commonly known as: COLACE   HYDROcodone-acetaminophen  5-325 MG tablet Commonly known as: NORCO/VICODIN   ipratropium 0.02 % nebulizer solution Commonly known as: ATROVENT    lisinopril  20 MG tablet Commonly known as: ZESTRIL    melatonin 5 MG Tabs   NUTREN 1.5 EN   ondansetron  8 MG tablet Commonly known as: ZOFRAN  Replaced by: ondansetron  4 MG/2ML Soln injection   oxyCODONE  5 MG/5ML solution Commonly known as: ROXICODONE    polyethylene glycol 17 g packet Commonly known as: MIRALAX  / GLYCOLAX    pregabalin  75 MG capsule Commonly known as: LYRICA    tamsulosin  0.4 MG Caps capsule Commonly known as: FLOMAX    traMADol  50 MG tablet Commonly known as: ULTRAM        TAKE these medications    antiseptic oral rinse Liqd Apply 15 mLs topically as needed for dry mouth.   artificial tears ophthalmic solution Place 1 drop into both eyes 4 (four) times daily as needed for dry eyes.   Glycerin  (Adult) 2 g Supp Place 1 suppository rectally daily as needed for moderate constipation or severe constipation.   glycopyrrolate 0.2 MG/ML injection Commonly known as: ROBINUL Inject 1 mL (0.2 mg total) into the vein every 4 (four)  hours as needed (excessive secretions).   haloperidol lactate 5 MG/ML injection Commonly known as: HALDOL Inject 0.2 mLs (1 mg total) into the vein every 4 (four) hours as needed (or delirium).   HYDROmorphone 1 MG/ML injection Commonly known as: DILAUDID Inject 0.5-2 mLs (0.5-2 mg total) into the vein every 30 (thirty) minutes as needed for severe pain (pain score 7-10) (To alleviate signs and symptoms of distress).   LORazepam 2 MG/ML injection Commonly known as: ATIVAN Inject 0.5 mLs (1 mg total) into the vein every 4 (four) hours as needed for anxiety.   ondansetron  4 MG/2ML Soln injection Commonly known as: ZOFRAN  Inject 2 mLs (4 mg total) into the vein every 6 (six) hours as needed for nausea. Replaces: ondansetron  8 MG tablet        Discharge Exam:   Subjective: Patient is barely able to speak, denies pain.  On O2.   Objective: Vitals:   10/25/24 1100 10/25/24 1404  BP: (!) 117/55 (!) 91/51  Pulse: 90 79  Resp: (!) 28 20  Temp: 98.3 F (36.8 C) 100 F (37.8 C)  SpO2: 94% (!) 84%    Intake/Output Summary (Last 24 hours) at 10/25/2024 1633  Last data filed at 10/25/2024 1520 Gross per 24 hour  Intake 2303 ml  Output 750 ml  Net 1553 ml   Filed Weights   10/22/24 1814  Weight: 43.6 kg    Exam:  General:  Appears frail, very ill (acutely on chronically), cachectic Eyes:  normal lids, iris ENT:  grossly normal hearing, lips & tongue, mmm Cardiovascular:  RRR. No LE edema.  Respiratory:   CTA bilaterally with no wheezes/rales/rhonchi.  Normal respiratory effort. Abdomen:  soft, NT, ND Skin:  no rash or induration seen on limited exam Musculoskeletal:   no bony abnormality Psychiatric: blunted mood and affect, speech sparse but appropriate Neurologic: unable to effectively perform  Data Reviewed: I have reviewed the patient's lab results since admission.  Pertinent labs for today include:   Na++ 141 Glucose 134 BUN 53/Creatinine 1.42/GFR  48 Albmuin 2.9 LDH 424 Ferritin 462 CRP 11.6 ESR 43 D-dimer 13.49 Fibrinogen  518 WBC 10.9 WBC 10.9 Hgb 12 Platelets 68     Condition at discharge: poor  The results of significant diagnostics from this hospitalization (including imaging, microbiology, ancillary and laboratory) are listed below for reference.   Imaging Studies: DG CHEST PORT 1 VIEW Result Date: 10/25/2024 CLINICAL DATA:  Shortness of breath. EXAM: PORTABLE CHEST 1 VIEW COMPARISON:  Radiograph yesterday FINDINGS: Progression in diffuse reticular opacities throughout both lungs. More patchy opacity in the lung bases are developing. Redemonstrated elevated left hemidiaphragm. The heart is stable in size. Aortic atherosclerosis. Question developing small pleural effusions. No pneumothorax. IMPRESSION: 1. Progression in diffuse reticular opacities throughout both lungs, with developing patchy opacity in the lung bases. Findings may represent pulmonary edema, pneumonia, or combination there of. 2. Question developing small pleural effusions. Electronically Signed   By: Andrea Gasman M.D.   On: 10/25/2024 12:32   DG CHEST PORT 1 VIEW Result Date: 10/24/2024 CLINICAL DATA:  Shortness of breath. EXAM: PORTABLE CHEST 1 VIEW COMPARISON:  Radiograph yesterday FINDINGS: Stable heart size and mediastinal contours. Aortic atherosclerosis. Persistent bibasilar opacities with mild improvement. No progressive airspace disease. Calcified granuloma in the right lung. No pneumothorax or pleural effusion. No pulmonary edema. IMPRESSION: Persistent bibasilar opacities with mild improvement from yesterday. Electronically Signed   By: Andrea Gasman M.D.   On: 10/24/2024 15:49   DG CHEST PORT 1 VIEW Result Date: 10/23/2024 EXAM: 1 VIEW(S) XRAY OF THE CHEST 10/23/2024 05:02:00 AM COMPARISON: 10/22/2024 CLINICAL HISTORY: SOB (shortness of breath) FINDINGS: LUNGS AND PLEURA: Stable bibasilar patchy opacities. Right upper lung calcified  granuloma. No pleural effusion. No pneumothorax. HEART AND MEDIASTINUM: Atherosclerotic calcifications. No acute abnormality of the cardiac and mediastinal silhouettes. BONES AND SOFT TISSUES: No acute osseous abnormality. IMPRESSION: 1. Stable bibasilar patchy opacities. Electronically signed by: Waddell Calk MD 10/23/2024 06:53 AM EST RP Workstation: [id number]   DG CHEST PORT 1 VIEW Result Date: 10/22/2024 CLINICAL DATA:  Shortness of breath.  COVID. EXAM: PORTABLE CHEST 1 VIEW COMPARISON:  10/20/2024 FINDINGS: Patchy opacity persists at the right lung base, with increasing patchy opacity throughout both mid lung zones in the left lung base. Stable heart size and mediastinal contours. Aortic atherosclerosis. No pneumothorax or large pleural effusion. IMPRESSION: Increasing bilateral lung opacities. Electronically Signed   By: Andrea Gasman M.D.   On: 10/22/2024 10:50   DG Chest Port 1 View Result Date: 10/20/2024 EXAM: 1 VIEW(S) XRAY OF THE CHEST 10/20/2024 06:58:00 PM COMPARISON: None available. CLINICAL HISTORY: Questionable sepsis - evaluate for abnormality FINDINGS: LUNGS AND PLEURA: The lungs are symmetrically hyperinflated in  keeping with changes of underlying COPD. Patchy right lower lobe airspace opacity, suspicious for pneumonia. No pleural effusion. No pneumothorax. HEART AND MEDIASTINUM: Thoracic aortic atherosclerosis. BONES AND SOFT TISSUES: No acute osseous abnormality. IMPRESSION: 1. Patchy right lower lobe airspace opacity, suspicious for pneumonia. 2. Symmetrically hyperinflated lungs, consistent with underlying COPD. Electronically signed by: Dorethia Molt MD 10/20/2024 08:37 PM EST RP Workstation: [id number]    Microbiology: Results for orders placed or performed during the hospital encounter of 10/20/24  Blood Culture (routine x 2)     Status: None   Collection Time: 10/20/24  6:14 PM   Specimen: BLOOD RIGHT ARM  Result Value Ref Range Status   Specimen Description   Final     BLOOD RIGHT ARM Performed at [facility], [address], [place], [place] [address]    Special Requests   Final    BOTTLES DRAWN AEROBIC AND ANAEROBIC Blood Culture results may not be optimal due to an inadequate volume of blood received in culture bottles Performed at [facility], [address], [place], [place] [address]    Culture   Final    NO GROWTH 5 DAYS Performed at [facility], [address], [place], [place] [address]    Report Status 10/25/2024 FINAL  Final  Blood Culture (routine x 2)     Status: None   Collection Time: 10/20/24  6:17 PM   Specimen: BLOOD LEFT FOREARM  Result Value Ref Range Status   Specimen Description   Final    BLOOD LEFT FOREARM Performed at [facility], [address] [place], [place] [address]    Special Requests   Final    BOTTLES DRAWN AEROBIC AND ANAEROBIC Blood Culture results may not be optimal due to an inadequate volume of blood received in culture bottles Performed at [facility], [address] [place], [place] [address]    Culture   Final    NO GROWTH 5 DAYS Performed at [facility], [address] [place], [place] [address]    Report Status 10/25/2024 FINAL  Final  Resp panel by RT-PCR (RSV, Flu A&B, Covid) Anterior Nasal Swab     Status: Abnormal   Collection Time: 10/20/24  6:34 PM   Specimen: Anterior Nasal Swab  Result Value Ref Range Status   SARS Coronavirus 2 by RT PCR POSITIVE (A) NEGATIVE Final    Comment: (NOTE) SARS-CoV-2 target nucleic acids are DETECTED.  The SARS-CoV-2 RNA is generally detectable in upper respiratory specimens during the acute phase of infection. Positive results are indicative of the presence of the identified virus, but do not rule out bacterial infection or co-infection with other pathogens not detected by the test. Clinical correlation with patient history and other diagnostic information is  necessary to determine patient infection status. The expected result is Negative.  Fact Sheet for Patients: bloggercourse.com  Fact Sheet for Healthcare Providers: seriousbroker.it  This test is not yet approved or cleared by the [place]  FDA and  has been authorized for detection and/or diagnosis of SARS-CoV-2 by FDA under an Emergency Use Authorization (EUA).  This EUA will remain in effect (meaning this test can be used) for the duration of  the COVID-19 declaration under Section 564(b)(1) of the A ct, 21 U.S.C. section 360bbb-3(b)(1), unless the authorization is terminated or revoked sooner.     Influenza A by PCR NEGATIVE NEGATIVE Final   Influenza B by PCR NEGATIVE NEGATIVE Final  Comment: (NOTE) The Xpert Xpress SARS-CoV-2/FLU/RSV plus assay is intended as an aid in the diagnosis of influenza from Nasopharyngeal swab specimens and should not be used as a sole basis for treatment. Nasal washings and aspirates are unacceptable for Xpert Xpress SARS-CoV-2/FLU/RSV testing.  Fact Sheet for Patients: bloggercourse.com  Fact Sheet for Healthcare Providers: seriousbroker.it  This test is not yet approved or cleared by the [place]  FDA and has been authorized for detection and/or diagnosis of SARS-CoV-2 by FDA under an Emergency Use Authorization (EUA). This EUA will remain in effect (meaning this test can be used) for the duration of the COVID-19 declaration under Section 564(b)(1) of the Act, 21 U.S.C. section 360bbb-3(b)(1), unless the authorization is terminated or revoked.     Resp Syncytial Virus by PCR NEGATIVE NEGATIVE Final    Comment: (NOTE) Fact Sheet for Patients: bloggercourse.com  Fact Sheet for Healthcare Providers: seriousbroker.it  This test is not yet approved or cleared by the [place]  FDA  and has been authorized for detection and/or diagnosis of SARS-CoV-2 by FDA under an Emergency Use Authorization (EUA). This EUA will remain in effect (meaning this test can be used) for the duration of the COVID-19 declaration under Section 564(b)(1) of the Act, 21 U.S.C. section 360bbb-3(b)(1), unless the authorization is terminated or revoked.  Performed at [facility], [address] [place], [place] [address]     Labs: CBC: Recent Labs  Lab 10/20/24 1821 10/21/24 0449 10/22/24 0910 10/23/24 0733 10/24/24 0448 10/25/24 0525  WBC 19.7* 12.2* 11.6* 11.2* 13.6* 10.9*  NEUTROABS 17.8*  --  10.0* 10.2* 12.7* 9.9*  HGB 13.8 12.4* 12.6* 11.8* 11.6* 12.0*  HCT 45.0 38.9* 39.1 36.7* 35.0* 36.3*  MCV 100.2* 97.7 95.6 95.6 93.8 93.6  PLT 140* 78* 67* 56* 58* 68*   Basic Metabolic Panel: Recent Labs  Lab 10/21/24 0449 10/21/24 2031 10/22/24 0910 10/23/24 0733 10/24/24 0448 10/25/24 0525  NA 160* 158* 155* 150* 147* 141  K 4.3 4.4 3.9 3.3* 3.2* 3.7  CL 122* 119* 120* 116* 111 108  CO2 22 27 24 25 24 22   GLUCOSE 111* 82 141* 214* 185* 134*  BUN 88* 74* 74* 67* 60* 53*  CREATININE 2.68* 1.99* 1.93* 1.67* 1.63* 1.43*  CALCIUM  9.0 9.4 9.0 8.7* 8.8* 8.7*  MG 3.3*  --  2.9* 2.6* 2.8* 2.5*  PHOS 4.9*  --  3.7 3.7 3.4 2.6   Liver Function Tests: Recent Labs  Lab 10/21/24 0449 10/22/24 0910 10/23/24 0733 10/24/24 0448 10/25/24 0525  AST 112* 110* 59* 44* 34  ALT 30 38 34 30 28  ALKPHOS 72 71 66 95 74  BILITOT 0.9 0.8 0.7 0.8 0.9  PROT 7.1 6.8 6.2* 6.5 6.6  ALBUMIN 3.2* 3.0* 2.8* 2.9* 2.9*   CBG: No results for input(s): GLUCAP in the last 168 hours.  Discharge time spent: greater than 30 minutes.  Signed: Delon Herald, MD [facility] 10/25/2024 "

## 2024-10-25 NOTE — Progress Notes (Signed)
 Report called to Cassie, CHARITY FUNDRAISER at [facility]: Lambert Rhonda W.

## 2024-10-25 NOTE — Progress Notes (Signed)
 Bruce Daniel [address] [facility] hospital liaison note   Referral received from [facility] for family interest in [facility].    Met with patient and significant other in room and called son Alm to explain services and hospice philosophy and all questions answered.  [facility] is able to accept patient this afternoon once consents are complete.    RN staff, you may call report at any time to [phone], room is assigned when report is called.  Please leave IV intact and send completed DNR with patient.   Updated attending and [facility] manager via [employer].  Thank you for the opportunity to participate in this patient's care Amy Darien BSN, RN [facility] Liaison [phone]

## 2024-10-25 NOTE — Assessment & Plan Note (Signed)
 Slowly decreasing since admission Transitioned to comfort care

## 2024-10-25 NOTE — Assessment & Plan Note (Signed)
 Incidental finding  Initially, he was not requiring oxygen but now on O2 No fevers or SOB It is unclear how many days he has been sick Given Remdesivir  and steroids Transitioned to comfort care

## 2024-10-25 NOTE — Care Management Important Message (Signed)
 Important Message  Patient Details IM Letter given. Name: Bruce Daniel MRN: [id number] Date of Birth: Oct 23, 1941   Important Message Given:  Yes - Medicare IM     Melba Ates 10/25/2024, 12:11 PM

## 2024-10-25 NOTE — Assessment & Plan Note (Signed)
 Worsening since admission Heparin  stopped Transitioned to comfort care

## 2024-10-25 NOTE — Assessment & Plan Note (Signed)
 In the setting of dehydration He has no way to receive oral nutrition Family and patient DO NOT want another PEG Na++ has improved with IVF The family is in agreement with transition to comfort care and residential hospice He has been accepted to [facility]

## 2024-10-25 NOTE — Assessment & Plan Note (Deleted)
 Palliative care consult for goals of care discussion. Son to arrive at some time and he is Now DNR.SABRA [name] hospice appropriate and will need continued goals of care discussion given that he does not want his PEG tube replaced.  He remains DNR

## 2024-10-25 NOTE — Assessment & Plan Note (Signed)
 Wound 10/21/24 0100 Pressure Injury Sacrum Mid Stage 1 -  Intact skin with non-blanchable redness of a localized area usually over a bony prominence. (Active)  POA

## 2024-10-25 NOTE — TOC Progression Note (Addendum)
 Transition of Care [facility]) - Progression Note    Patient Details  Name: Bruce Daniel MRN: [id number] Date of Birth: 04/16/1941  [facility]) CM/SW Contact  Doneta Glenys DASEN, RN Phone Number: 10/25/2024, 12:18 PM  Clinical Narrative:    2:01 PM CM spoke with [facility] [place] -SW [phone] ext. [phone]. Accepted patient. Patient can tranfer on 10/27/2024 at 9:00 AM to [facility] in [place].Patient will be transported by [employer]. Patients nurse will call [phone] ext [phone])report day of discharge. 12:32 PM CM spoke with Denna Gadrau, NP at [facility] inpatient hospice in [place]. [facility] will be [facility]. Once [facility] has made contact, will set up for transportation to [facility] in [place]. 12:18 PM CM called Nicole(SW) [phone]. [phone] and Bernarda Jump [phone] ext.[phone]   Expected Discharge Plan: Hospice Medical Facility Barriers to Discharge: Continued Medical Work up               Expected Discharge Plan and Services In-house Referral: Hospice / Palliative Care Discharge Planning Services: CM Consult   Living arrangements for the past 2 months: Single Family Home                 DME Arranged: N/A DME Agency: NA       HH Arranged: NA HH Agency: NA         Social Drivers of Health (SDOH) Interventions SDOH Screenings   Food Insecurity: Patient Unable To Answer (10/21/2024)  Housing: Unknown (10/21/2024)  Transportation Needs: No Transportation Needs (10/21/2024)  Utilities: Patient Unable To Answer (10/21/2024)  Social Connections: Socially Integrated (10/21/2024)  Tobacco Use: Medium Risk (08/17/2024)   Received from [facility]    Readmission Risk Interventions    10/23/2024   10:05 AM  Readmission Risk Prevention Plan  Transportation Screening Complete  PCP or Specialist Appt within 5-7 Days Complete  Home Care Screening Complete  Medication Review (RN CM) Complete

## 2024-10-25 NOTE — TOC Transition Note (Addendum)
 Transition of Care [facility]) - Discharge Note   Patient Details  Name: Bruce Daniel MRN: [id number] Date of Birth: August 09, 1941  [facility]) CM/SW Contact:  Doneta Glenys DASEN, RN Phone Number: 10/25/2024, 5:22 PM   Clinical Narrative:    [facility]. Patient being discharged to [facility]. [facility] called and place on will for pick up. Face Sheet and Medical Necessity sent to [facility]W printed.  IP CM signing off.   Final next level of care: Hospice Medical Facility Barriers to Discharge: Barriers Resolved   Patient Goals and CMS Choice Patient states their goals for this hospitalization and ongoing recovery are:: [facility] CMS Medicare.gov Compare Post Acute Care list provided to:: Patient Represenative (must comment) Choice offered to / list presented to : Adult Children [place] ownership interest in [facility].provided to:: Adult Children    Discharge Placement              Patient [facility] bed at:  [facility] Patient to be transferred to facility by: [facility] Name of family member notified: Alm Patient and family notified of of transfer: 10/25/24  Discharge Plan and Services Additional resources added to the After Visit Summary for   In-house Referral: Hospice / Palliative Care Discharge Planning Services: CM Consult            DME Arranged: N/A DME Agency: NA       HH Arranged: NA HH Agency: NA        Social Drivers of Health (SDOH) Interventions SDOH Screenings   Food Insecurity: Patient Unable To Answer (10/21/2024)  Housing: Unknown (10/21/2024)  Transportation Needs: No Transportation Needs (10/21/2024)  Utilities: Patient Unable To Answer (10/21/2024)  Social Connections: Socially Integrated (10/21/2024)  Tobacco Use: Medium Risk (08/17/2024)   Received from [facility]     Readmission Risk Interventions    10/23/2024   10:05 AM  Readmission Risk Prevention Plan  Transportation Screening  Complete  PCP or Specialist Appt within 5-7 Days Complete  Home Care Screening Complete  Medication Review (RN CM) Complete

## 2024-10-25 NOTE — Assessment & Plan Note (Deleted)
 is an incidental finding and intiitally was not requiring oxygen but now on O2.  NO fevers or sob. It is unclear how many days he has been sick.  He did start to feel worse 2 days ago, but that is after his PEG tube came out and he has been without any hydration or nutrition since that time. Check Inflammatory Markers: -Getting Remdesivir . Started him on Steroids w/ 0.5 mg/kg BID x3 Days and then Prednisone . give how high his Inflammatory Markers are -D-Dimer went up but unfortunately not on Pharmacologic VTE Prophylaxis due to Thrombocytopenia -Repeat CXR In the AM; CXR 12/30 showed Persistent bibasilar opacities with mild improvement from yesterday.

## 2024-10-25 NOTE — Assessment & Plan Note (Deleted)
 In the setting of Dehydration; He has no way to receive oral nutrition. IVF w/ D5W at 75 mL/hr. Family and patient DO NOT want another PEG. Na+ Trend Improving and went from 164 -> 160 -> 158 -> 155 -> 150 -> 147.  -After further goals of care discussion the patient and the family have made it clear that he does not want a PEG tube.  The current plan is to continue IV fluids and current mode of care for now and have no temporary feedings.  The family is liklely pursuing residential hospice with her first choice being the [facility] in [place] or [facility] if that is not possible.  The [facility] team has faxed the patient out for hospice screening and family is agreeable to pursue inpatient hospice care and [facility] working with the [facility] and coordinating care Resume IVF as above w/ D5W and will continue now until transitioned to Residential Hospice at the [facility] or [facility]

## 2024-10-25 NOTE — Progress Notes (Signed)
 " Daily Progress Note   Patient Name: Bruce Daniel       Date: 10/25/2024 DOB: [birth year]  Age: 83 y.o. MRN#: [id number] Attending Physician: Barbarann Nest, MD Primary Care Physician: [name], MD Admit Date: 10/20/2024 Length of Stay: 5 days  Reason for Consultation/Follow-up: Establishing goals of care  Subjective:   Reviewed EMR including recent documentation from hospitalist and TOC.  Discussed care with hospitalist, TOC, bedside RN to coordinate care.  ------------------------------------------------------------------------------------------------------------- Advance Care Planning Conversation  Pertinent diagnosis: Dehydration, recurrent laryngeal cancer, severe aortic stenosis, electrolyte abnormalities, COVID-pneumonia, poor oral intake in setting of s/p PEG tube removal and dysphagia, hypoxemia, AKI  The patient and family consented to a voluntary Advance Care Planning Conversation in person and over the phone. Individuals present for the conversation: Patient unable to participate in complex medical decision making due to underlying medical status.  Discussed care with patient significant other, Ronal, at bedside and patient's son/HCPOA Alm over the phone.  Summary of the conversation:  Presented to bedside in afternoon to see patient.  Patient laying in bed with face mask in place.  Patient's significant other, Ronal, present at bedside.  Discussed patient's symptoms for today.  Ronal described patient is having worsening shortness of breath.  Patient able to answer somewhat though confused.  Patient does acknowledge shortness of breath.  Can clearly see increased work of breathing.  Patient does not like to keep oxygen mask on either as it causes him irritation.  Discussed that with patient not eating, concerned that continuous IV fluids can go into places where they should not such as ones lungs causing worsening shortness of breath.  I again addressed hope for care moving  forward.  Ronal asks this provider to speak with patient's son, Alm, as noted I would do so.  Did explain to [facility] that with focusing on getting patient to hospice, priority should be patient's comfort at this time.  Discussed how that would mean discontinuing IV fluids, lab work, and imaging and instead providing medications for symptom management at end-of-life.  Ronal very much supporting of this submitted would call and discussed with Alm as well.  All questions answered at that time.  Able to call Alm and speak with him regarding visit with his father.  Again discussed goals for medical care moving forward.  Alm notes that he just wants patient to be comfortable in do not suffer anymore.  Acknowledged this.  Discussed with him focused to full comfort focused care and what this would and would not entail.  Discussed discontinuing things such as IV fluids, imaging, and lab work and instead focusing on symptom management and end-of-life.  Discussed how patient is not wanting to wear his oxygen and want to respect this so instead we will provide medications for his work of breathing.  Son very supportive of transitioning to full comfort focused care at this time.  [facility]  Alm is hopeful that patient could stay here in [facility] and go to [facility] for inpatient hospice care.  Alm noted he had spoken with the [facility] about approval for this versus going to [facility].  Alm is supportive of patient going to [facility] if this is at all possible.  Acknowledged this and noted would inform TOC.  All questions answered at that time.  Noted palliative medicine team to continue to follow patient's medical journey.  Outcome of the conversations and/or documents completed:  Transition to full comfort focused care at this time.  Referral for [facility] evaluation and possible transfer for inpatient hospice management.  I spent 30 minutes providing separately  identifiable ACP services with the patient and/or surrogate decision maker in a voluntary, in-person conversation discussing the patient's wishes and goals as detailed in the above note.  Tinnie Radar, DO Palliative Medicine Provider  ------------------------------------------------------------------------------------------------------------- Discussed care with hospitalist, bedside RN, [facility] liaison, and TOC throughout the day to coordinate care and transition to full comfort focused care.  Objective:   Vital Signs:  BP 136/64 (BP Location: Left Arm)   Pulse (!) 106   Temp 99 F (37.2 C) (Axillary)   Resp (!) 26   Wt 43.6 kg   SpO2 97%   Physical Exam: General: Chronically ill-appearing, frail, confused Cardiovascular: RRR Respiratory: increased work of breathing noted Abdomen: nondistended Extremities: Muscle wasting present in all extremities Neuro: confused  Assessment & Plan:   Assessment: Patient is an 83 year old male with a past medical history of recurrent laryngeal cancer, severe aortic stenosis, and hypertension who was admitted on 10/20/2024 for management of severe dehydration and severe hyponatremia.  Hospitalization medicine discussions were had and patient has stated that he does not wish to have PEG tube reinserted for nutrition.  Palliative team consulted to assist with complex medical decision making.  Recommendations/Plan: # Complex medical decision making/goals of care:  - Patient unable to participate in complex medical decision making due to underlying medical status.  - Discussed care with patient's son and significant other as detailed above in HPI.  Transition to full comfort focused care at this time.  [facility] to evaluate for possible transfer to [facility] for inpatient hospice and end-of-life care.  Palliative medicine team continuing to follow with patient's medical journey.  -  Code Status: Do not attempt resuscitation (DNR) - Comfort care  # Symptom  management Patient is receiving these palliative interventions for symptom management with an intent to improve quality of life.     -Pain/Dyspnea, acute in the setting of end-of-life care                               -Start IV Dilaudid 0.5-2 mg IV every 30 minutes as needed.  Continue to adjust based on patient's symptom burden.  If patient needing frequent dosing, may need to consider continuous infusion.                  -Anxiety/agitation, in the setting of end-of-life care                               -Start IV Ativan 1 mg every 4 hours as needed. Continue to adjust based on patient's symptom burden.                                 -Start IV Haldol 1 mg every 4 hours as needed. Continue to adjust based on patient's symptom burden.                   -Secretions, in the setting of end-of-life care                               -Start IV glycopyrrolate 0.2 mg every 4 hours as needed.  # Psychosocial Support:  - Son-David Splawn/HCPOA,  significant other-Mary Shipp  # Discharge Planning: Hospice facility  Thank you for allowing the palliative care team to participate in the care Deward Hacker.  Tinnie Radar, DO Palliative Care Provider PMT # [phone]  If patient remains symptomatic despite maximum doses, please call PMT at [phone] between 0700 and 1900. Outside of these hours, please call attending, as PMT does not have night coverage.  Billing based on MDM: High  Problems Addressed: One or more chronic illnesses with severe exacerbation, progression, or side effects of treatment.  Risks: Parenteral controlled substances  "

## 2024-10-25 NOTE — Plan of Care (Signed)

## 2024-10-25 NOTE — Assessment & Plan Note (Signed)
 Baseline creatinine 1.2, increased to 3.33 on presentation Improving with IVF LIkely related to dehydration in the setting of no PEG tube and inability to tolerate PO intake

## 2024-10-25 NOTE — Assessment & Plan Note (Signed)
 No home medications.

## 2024-10-25 NOTE — Assessment & Plan Note (Deleted)
 Palliative care consult for goals of care discussion. Son to arrive at some time and he is Now DNR.Bruce Daniel [name] hospice appropriate and will need continued goals of care discussion given that he does not want his PEG tube replaced.  He remains DNR

## 2024-10-25 NOTE — Assessment & Plan Note (Signed)
 Not a candidate for valve replacement and so not a candidate for further treatment of his laryngeal cancer

## 2024-10-25 NOTE — Progress Notes (Signed)
Called respiratory to assess patient. 

## 2024-10-26 DIAGNOSIS — Z79899 Other long term (current) drug therapy: Secondary | ICD-10-CM

## 2024-10-26 NOTE — Assessment & Plan Note (Signed)
 Worsening since admission Heparin  stopped Transitioned to comfort care

## 2024-10-26 NOTE — Assessment & Plan Note (Signed)
 No home medications.

## 2024-10-26 NOTE — Assessment & Plan Note (Signed)
 Slowly decreasing since admission Transitioned to comfort care

## 2024-10-26 NOTE — Assessment & Plan Note (Signed)
 Not a candidate for valve replacement and so not a candidate for further treatment of his laryngeal cancer

## 2024-10-26 NOTE — Assessment & Plan Note (Signed)
 Palliative care consulted for goals of care discussions Given his inability to take PO and unwillingness to have another PEG tube placed, he has been transitioned to comfort care and will be discharged to residential hospice He remains DNR

## 2024-10-26 NOTE — Assessment & Plan Note (Signed)
 Incidental finding  Initially, he was not requiring oxygen but now on O2 No fevers or SOB It is unclear how many days he has been sick Given Remdesivir  and steroids Transitioned to comfort care

## 2024-10-26 NOTE — Assessment & Plan Note (Signed)
 Changed to DNR by MD on 12/27 Now comfort care status Has gold out of facility DNR form on the chart

## 2024-10-26 NOTE — Assessment & Plan Note (Signed)
 In the setting of dehydration He has no way to receive oral nutrition Family and patient DO NOT want another PEG Na++ has improved with IVF The family is in agreement with transition to comfort care and residential hospice He has been accepted to [facility]

## 2024-10-26 NOTE — Progress Notes (Signed)
 " Daily Progress Note   Patient Name: Bruce Daniel       Date: 10/26/2024 DOB: Apr 17, 1941  Age: 84 y.o. MRN#: [id number] Attending Physician: Barbarann Nest, MD Primary Care Physician: [name], MD Admit Date: 10/20/2024 Length of Stay: 6 days  Reason for Consultation/Follow-up: Establishing goals of care  Subjective:   Reviewed EMR including recent documentation from hospitalist, TOC, and [facility] liaison.  Discussed care with hospitalist, TOC, bedside RN, and [facility] liaison to coordinate care today.  Son has signed consents and plan is for patient to be transferred to [facility] today.  At time of EMR reviewed past 24 hours patient has received as needed IV Dilaudid 1 mg x 2 doses.  Family including patient's son and significant other agreeing with transfer today.  Objective:   Vital Signs:  BP (!) 151/57 (BP Location: Right Arm)   Pulse 73   Temp 97.6 F (36.4 C)   Resp (!) 21   Wt 43.6 kg   SpO2 94%   Physical Exam: General: Chronically ill-appearing, frail, confused Cardiovascular: RRR Respiratory: increased work of breathing noted Abdomen: nondistended Extremities: Muscle wasting present in all extremities Neuro: confused  Assessment & Plan:   Assessment: Patient is an 84 year old male with a past medical history of recurrent laryngeal cancer, severe aortic stenosis, and hypertension who was admitted on 10/20/2024 for management of severe dehydration and severe hyponatremia.  Hospitalization medicine discussions were had and patient has stated that he does not wish to have PEG tube reinserted for nutrition.  Palliative team consulted to assist with complex medical decision making.  Recommendations/Plan: # Complex medical decision making/goals of care:  - Patient unable to participate in complex medical decision making due to underlying medical status.  - Had discussed care with patient's son and significant other and patient was transition to full comfort focused care on  10/25/2024.  Planning for transfer today to [facility] for inpatient hospice and end-of-life care.  Palliative medicine team will be available if needed.  -  Code Status: Do not attempt resuscitation (DNR) - Comfort care  # Symptom management Patient is receiving these palliative interventions for symptom management with an intent to improve quality of life.     -Pain/Dyspnea, acute in the setting of end-of-life care                               - Continue IV Dilaudid 0.5-2 mg IV every 30 minutes as needed.  Continue to adjust based on patient's symptom burden.  If patient needing frequent dosing, may need to consider continuous infusion.                  -Anxiety/agitation, in the setting of end-of-life care                               - Continue IV Ativan 1 mg every 4 hours as needed. Continue to adjust based on patient's symptom burden.                                 - Continue IV Haldol 1 mg every 4 hours as needed. Continue to adjust based on patient's symptom burden.                   -Secretions, in the setting of end-of-life  care                               - Continue IV glycopyrrolate 0.2 mg every 4 hours as needed.  # Psychosocial Support:  - Son-David Juniel/HCPOA, significant other-Mary Shipp  # Discharge Planning: Hospice facility  Thank you for allowing the palliative care team to participate in the care Deward Hacker.  Tinnie Radar, DO Palliative Care Provider PMT # [phone]  If patient remains symptomatic despite maximum doses, please call PMT at [phone] between 0700 and 1900. Outside of these hours, please call attending, as PMT does not have night coverage.  "

## 2024-10-26 NOTE — TOC Transition Note (Addendum)
 Transition of Care [facility]) - Discharge Note   Patient Details  Name: Bruce Daniel MRN: [id number] Date of Birth: [birth year]  Transition of Care [facility]) CM/SW Contact:  Bascom Service, RN Phone Number: 10/26/2024, 10:41 AM   Clinical Narrative: d/c to [facility]- rep Amy following.Report tel#[phone].Await Nsg to inform when [facility] can be called.    --Per Amy w/[facility]-called [facility] since [facility] closed for transportation to [facility] No further CM needs..     Final next level of care: Hospice Medical Facility Barriers to Discharge: No Barriers Identified   Patient Goals and CMS Choice Patient states their goals for this hospitalization and ongoing recovery are:: [facility] CMS Medicare.gov Compare Post Acute Care list provided to:: Patient Represenative (must comment) (David(son)) Choice offered to / list presented to : Adult Children [place] ownership interest in [facility].provided to:: Adult Children    Discharge Placement              Patient chooses bed at:  [facility]) Patient to be transferred to facility by: [facility] Name of family member notified: Alm Patient and family notified of of transfer: 10/25/24  Discharge Plan and Services Additional resources added to the After Visit Summary for   In-house Referral: Hospice / Palliative Care Discharge Planning Services: CM Consult            DME Arranged: N/A DME Agency: NA       HH Arranged: NA HH Agency: NA        Social Drivers of Health (SDOH) Interventions SDOH Screenings   Food Insecurity: Patient Unable To Answer (10/21/2024)  Housing: Unknown (10/21/2024)  Transportation Needs: No Transportation Needs (10/21/2024)  Utilities: Patient Unable To Answer (10/21/2024)  Social Connections: Socially Integrated (10/21/2024)  Tobacco Use: Medium Risk (08/17/2024)   Received from [facility]     Readmission Risk Interventions    10/23/2024   10:05 AM  Readmission Risk Prevention  Plan  Transportation Screening Complete  PCP or Specialist Appt within 5-7 Days Complete  Home Care Screening Complete  Medication Review (RN CM) Complete

## 2024-10-26 NOTE — Assessment & Plan Note (Signed)
 Baseline creatinine 1.2, increased to 3.33 on presentation Improving with IVF LIkely related to dehydration in the setting of no PEG tube and inability to tolerate PO intake

## 2024-10-26 NOTE — Assessment & Plan Note (Signed)
 Wound 10/21/24 0100 Pressure Injury Sacrum Mid Stage 1 -  Intact skin with non-blanchable redness of a localized area usually over a bony prominence. (Active)  POA

## 2024-10-26 NOTE — Discharge Summary (Signed)
 " Physician Discharge Summary   Patient: Bruce Daniel MRN: [id number] DOB: 21-Oct-1941  Admit date:     10/20/2024  Discharge date: 10/26/2024  Discharge Physician: Delon Herald   PCP: [name], MD   Recommendations at discharge:    You are being discharged to residential hospice at [facility]  Discharge Diagnoses: Principal Problem:   Hypernatremia Active Problems:   HTN (hypertension)   BPH (benign prostatic hyperplasia)   Laryngeal cancer (HCC)   Severe aortic stenosis   Dysphagia   COVID-19 virus infection   Palliative care by specialist   Goals of care, counseling/discussion   Counseling and coordination of care   Pressure injury of skin   AKI (acute kidney injury)   Normocytic anemia   Thrombocytopenia   Concern about end of life   DNR (do not resuscitate)   High risk medication use   Shortness of breath   ACP (advance care planning)    Hospital Course: 84yo with h/o 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  Following cancer diagnosis, he was discharged to nursing facility for rehabilitation and returned home approximately 2 weeks ago.  The patient was able to walk with a walker at the time of discharge but approximately 3 days prior to admission his PEG tube came out and he refused to go to the ER or seek any care.  On presentation he was found to have significant hypernatremia and COVID-19 infection.  Na++ is slowing improving.  Patient is refusing PEG replacement.  Inflammatory markers are elevated due to COVID and CXR showed worsening so he was started on steroids. Palliative following for GOC Discussion and current plan is to continue IV fluids and current mode of care and pursue inpatient hospice care.  He has been faxed out to the [place] hosp[facility] in [place] vs. [facility].   Assessment and Plan:  Assessment & Plan Hypernatremia In the setting of dehydration He has no way to receive oral  nutrition Family and patient DO NOT want another PEG Na++ has improved with IVF The family is in agreement with transition to comfort care and residential hospice He has been accepted to [facility] Laryngeal cancer [facility]) Dysphagia Palliative care consulted for goals of care discussions Given his inability to take PO and unwillingness to have another PEG tube placed, he has been transitioned to comfort care and will be discharged to residential hospice He remains DNR COVID-19 virus infection Incidental finding  Initially, he was not requiring oxygen but now on O2 No fevers or SOB It is unclear how many days he has been sick Given Remdesivir  and steroids Transitioned to comfort care Severe aortic stenosis Not a candidate for valve replacement and so not a candidate for further treatment of his laryngeal cancer Pressure injury of skin Wound 10/21/24 0100 Pressure Injury Sacrum Mid Stage 1 -  Intact skin with non-blanchable redness of a localized area usually over a bony prominence. (Active)  POA HTN (hypertension) No home medications AKI (acute kidney injury) Baseline creatinine 1.2, increased to 3.33 on presentation Improving with IVF LIkely related to dehydration in the setting of no PEG tube and inability to tolerate PO intake Normocytic anemia Slowly decreasing since admission Transitioned to comfort care Thrombocytopenia Worsening since admission Heparin  stopped Transitioned to comfort care DNR (do not resuscitate) Changed to DNR by MD on 12/27 Now comfort care status Has gold out of facility DNR form on the chart      Consultants:  Palliative care PT OT SLP   Procedures: None   Antibiotics: Cefepime  x 1 Vancomycin  x 1 Remdesivir  x 3 doses     Disposition: Hospice care Diet recommendation:  NPO   DISCHARGE MEDICATION: Allergies as of 10/26/2024       Reactions   Other Other (See Comments)   Flu Vaccine, bad feelings   Venlafaxine Hcl Nausea Only    Bactrim [sulfamethoxazole-trimethoprim] Other (See Comments)   Unable to taste   Simvastatin Other (See Comments)   Leg pain   Sulfamethoxazole Other (See Comments)   Loss of Taste after taking        Medication List     STOP taking these medications    acetaminophen  500 MG tablet Commonly known as: TYLENOL    albuterol  (5 MG/ML) 0.5% nebulizer solution Commonly known as: PROVENTIL    aspirin  EC 81 MG tablet   atenolol 50 MG tablet Commonly known as: TENORMIN   atorvastatin  80 MG tablet Commonly known as: LIPITOR   docusate sodium  100 MG capsule Commonly known as: COLACE   HYDROcodone-acetaminophen  5-325 MG tablet Commonly known as: NORCO/VICODIN   ipratropium 0.02 % nebulizer solution Commonly known as: ATROVENT    lisinopril  20 MG tablet Commonly known as: ZESTRIL    melatonin 5 MG Tabs   NUTREN 1.5 EN   ondansetron  8 MG tablet Commonly known as: ZOFRAN  Replaced by: ondansetron  4 MG/2ML Soln injection   oxyCODONE  5 MG/5ML solution Commonly known as: ROXICODONE    polyethylene glycol 17 g packet Commonly known as: MIRALAX  / GLYCOLAX    pregabalin  75 MG capsule Commonly known as: LYRICA    tamsulosin  0.4 MG Caps capsule Commonly known as: FLOMAX    traMADol  50 MG tablet Commonly known as: ULTRAM        TAKE these medications    antiseptic oral rinse Liqd Apply 15 mLs topically as needed for dry mouth.   artificial tears ophthalmic solution Place 1 drop into both eyes 4 (four) times daily as needed for dry eyes.   Glycerin  (Adult) 2 g Supp Place 1 suppository rectally daily as needed for moderate constipation or severe constipation.   glycopyrrolate 0.2 MG/ML injection Commonly known as: ROBINUL Inject 1 mL (0.2 mg total) into the vein every 4 (four) hours as needed (excessive secretions).   haloperidol lactate 5 MG/ML injection Commonly known as: HALDOL Inject 0.2 mLs (1 mg total) into the vein every 4 (four) hours as needed (or  delirium).   HYDROmorphone 1 MG/ML injection Commonly known as: DILAUDID Inject 0.5-2 mLs (0.5-2 mg total) into the vein every 30 (thirty) minutes as needed for severe pain (pain score 7-10) (To alleviate signs and symptoms of distress).   LORazepam 2 MG/ML injection Commonly known as: ATIVAN Inject 0.5 mLs (1 mg total) into the vein every 4 (four) hours as needed for anxiety.   ondansetron  4 MG/2ML Soln injection Commonly known as: ZOFRAN  Inject 2 mLs (4 mg total) into the vein every 6 (six) hours as needed for nausea. Replaces: ondansetron  8 MG tablet        Discharge Exam:   Subjective: Reports generalized pain, not doing well.  Significant other is at the bedside and agrees with transfer to hospice.   Objective: Vitals:   10/25/24 1404 10/26/24 0523  BP: (!) 91/51 (!) 151/57  Pulse: 79 73  Resp: 20 (!) 21  Temp: 100 F (37.8 C) 97.6 F (36.4 C)  SpO2: (!) 84% 94%    Intake/Output Summary (Last 24 hours) at 10/26/2024 0818 Last data filed at 10/25/2024  1520 Gross per 24 hour  Intake 554.77 ml  Output --  Net 554.77 ml   Filed Weights   10/22/24 1814  Weight: 43.6 kg    Exam:  General:  Appears chronically ill, frail, cachectic Eyes:  normal lids, iris ENT:  grossly normal hearing, lips & tongue, mildly dry mm Cardiovascular:  RRR. No LE edema.  Respiratory:   CTA bilaterally with no wheezes/rales/rhonchi.  Normal respiratory effort. Abdomen:  soft, NT, ND Skin:  no rash or induration seen on limited exam Musculoskeletal:  generalized weakness, no bony abnormality Psychiatric: blunted mood and affect, speech fluent and appropriate, AOx3 Neurologic:  CN 2-12 grossly intact, moves all extremities in coordinated fashion  Data Reviewed: I have reviewed the patient's lab results since admission.  Pertinent labs for today include:   None    Condition at discharge: poor  The results of significant diagnostics from this hospitalization (including  imaging, microbiology, ancillary and laboratory) are listed below for reference.   Imaging Studies: DG CHEST PORT 1 VIEW Result Date: 10/25/2024 CLINICAL DATA:  Shortness of breath. EXAM: PORTABLE CHEST 1 VIEW COMPARISON:  Radiograph yesterday FINDINGS: Progression in diffuse reticular opacities throughout both lungs. More patchy opacity in the lung bases are developing. Redemonstrated elevated left hemidiaphragm. The heart is stable in size. Aortic atherosclerosis. Question developing small pleural effusions. No pneumothorax. IMPRESSION: 1. Progression in diffuse reticular opacities throughout both lungs, with developing patchy opacity in the lung bases. Findings may represent pulmonary edema, pneumonia, or combination there of. 2. Question developing small pleural effusions. Electronically Signed   By: Andrea Gasman M.D.   On: 10/25/2024 12:32   DG CHEST PORT 1 VIEW Result Date: 10/24/2024 CLINICAL DATA:  Shortness of breath. EXAM: PORTABLE CHEST 1 VIEW COMPARISON:  Radiograph yesterday FINDINGS: Stable heart size and mediastinal contours. Aortic atherosclerosis. Persistent bibasilar opacities with mild improvement. No progressive airspace disease. Calcified granuloma in the right lung. No pneumothorax or pleural effusion. No pulmonary edema. IMPRESSION: Persistent bibasilar opacities with mild improvement from yesterday. Electronically Signed   By: Andrea Gasman M.D.   On: 10/24/2024 15:49   DG CHEST PORT 1 VIEW Result Date: 10/23/2024 EXAM: 1 VIEW(S) XRAY OF THE CHEST 10/23/2024 05:02:00 AM COMPARISON: 10/22/2024 CLINICAL HISTORY: SOB (shortness of breath) FINDINGS: LUNGS AND PLEURA: Stable bibasilar patchy opacities. Right upper lung calcified granuloma. No pleural effusion. No pneumothorax. HEART AND MEDIASTINUM: Atherosclerotic calcifications. No acute abnormality of the cardiac and mediastinal silhouettes. BONES AND SOFT TISSUES: No acute osseous abnormality. IMPRESSION: 1. Stable  bibasilar patchy opacities. Electronically signed by: Waddell Calk MD 10/23/2024 06:53 AM EST RP Workstation: [id number]   DG CHEST PORT 1 VIEW Result Date: 10/22/2024 CLINICAL DATA:  Shortness of breath.  COVID. EXAM: PORTABLE CHEST 1 VIEW COMPARISON:  10/20/2024 FINDINGS: Patchy opacity persists at the right lung base, with increasing patchy opacity throughout both mid lung zones in the left lung base. Stable heart size and mediastinal contours. Aortic atherosclerosis. No pneumothorax or large pleural effusion. IMPRESSION: Increasing bilateral lung opacities. Electronically Signed   By: Andrea Gasman M.D.   On: 10/22/2024 10:50   DG Chest Port 1 View Result Date: 10/20/2024 EXAM: 1 VIEW(S) XRAY OF THE CHEST 10/20/2024 06:58:00 PM COMPARISON: None available. CLINICAL HISTORY: Questionable sepsis - evaluate for abnormality FINDINGS: LUNGS AND PLEURA: The lungs are symmetrically hyperinflated in keeping with changes of underlying COPD. Patchy right lower lobe airspace opacity, suspicious for pneumonia. No pleural effusion. No pneumothorax. HEART AND MEDIASTINUM: Thoracic aortic atherosclerosis. BONES AND  SOFT TISSUES: No acute osseous abnormality. IMPRESSION: 1. Patchy right lower lobe airspace opacity, suspicious for pneumonia. 2. Symmetrically hyperinflated lungs, consistent with underlying COPD. Electronically signed by: Dorethia Molt MD 10/20/2024 08:37 PM EST RP Workstation: [id number]    Microbiology: Results for orders placed or performed during the hospital encounter of 10/20/24  Blood Culture (routine x 2)     Status: None   Collection Time: 10/20/24  6:14 PM   Specimen: BLOOD RIGHT ARM  Result Value Ref Range Status   Specimen Description   Final    BLOOD RIGHT ARM Performed at [facility], [address], [place], [place] [address]    Special Requests   Final    BOTTLES DRAWN AEROBIC AND ANAEROBIC Blood Culture results may not be optimal due to an inadequate  volume of blood received in culture bottles Performed at [facility], [address], [place], [place] [address]    Culture   Final    NO GROWTH 5 DAYS Performed at [facility], [address] [place], [place] [address]    Report Status 10/25/2024 FINAL  Final  Blood Culture (routine x 2)     Status: None   Collection Time: 10/20/24  6:17 PM   Specimen: BLOOD LEFT FOREARM  Result Value Ref Range Status   Specimen Description   Final    BLOOD LEFT FOREARM Performed at [facility], [address], [place], [place] [address]    Special Requests   Final    BOTTLES DRAWN AEROBIC AND ANAEROBIC Blood Culture results may not be optimal due to an inadequate volume of blood received in culture bottles Performed at [facility], [address], [place], [place] [address]    Culture   Final    NO GROWTH 5 DAYS Performed at [facility], [address], [place], [place] [address]    Report Status 10/25/2024 FINAL  Final  Resp panel by RT-PCR (RSV, Flu A&B, Covid) Anterior Nasal Swab     Status: Abnormal   Collection Time: 10/20/24  6:34 PM   Specimen: Anterior Nasal Swab  Result Value Ref Range Status   SARS Coronavirus 2 by RT PCR POSITIVE (A) NEGATIVE Final    Comment: (NOTE) SARS-CoV-2 target nucleic acids are DETECTED.  The SARS-CoV-2 RNA is generally detectable in upper respiratory specimens during the acute phase of infection. Positive results are indicative of the presence of the identified virus, but do not rule out bacterial infection or co-infection with other pathogens not detected by the test. Clinical correlation with patient history and other diagnostic information is necessary to determine patient infection status. The expected result is Negative.  Fact Sheet for Patients: bloggercourse.com  Fact Sheet for Healthcare  Providers: seriousbroker.it  This test is not yet approved or cleared by the [place]  FDA and  has been authorized for detection and/or diagnosis of SARS-CoV-2 by FDA under an Emergency Use Authorization (EUA).  This EUA will remain in effect (meaning this test can be used) for the duration of  the COVID-19 declaration under Section 564(b)(1) of the A ct, 21 U.S.C. section 360bbb-3(b)(1), unless the authorization is terminated or revoked sooner.     Influenza A by PCR NEGATIVE NEGATIVE Final   Influenza B by PCR NEGATIVE NEGATIVE Final    Comment: (NOTE) The Xpert Xpress SARS-CoV-2/FLU/RSV plus assay is intended as an aid in the diagnosis of influenza from Nasopharyngeal swab specimens and should not be  used as a sole basis for treatment. Nasal washings and aspirates are unacceptable for Xpert Xpress SARS-CoV-2/FLU/RSV testing.  Fact Sheet for Patients: bloggercourse.com  Fact Sheet for Healthcare Providers: seriousbroker.it  This test is not yet approved or cleared by the [place]  FDA and has been authorized for detection and/or diagnosis of SARS-CoV-2 by FDA under an Emergency Use Authorization (EUA). This EUA will remain in effect (meaning this test can be used) for the duration of the COVID-19 declaration under Section 564(b)(1) of the Act, 21 U.S.C. section 360bbb-3(b)(1), unless the authorization is terminated or revoked.     Resp Syncytial Virus by PCR NEGATIVE NEGATIVE Final    Comment: (NOTE) Fact Sheet for Patients: bloggercourse.com  Fact Sheet for Healthcare Providers: seriousbroker.it  This test is not yet approved or cleared by the [place]  FDA and has been authorized for detection and/or diagnosis of SARS-CoV-2 by FDA under an Emergency Use Authorization (EUA). This EUA will remain in effect (meaning this test can be  used) for the duration of the COVID-19 declaration under Section 564(b)(1) of the Act, 21 U.S.C. section 360bbb-3(b)(1), unless the authorization is terminated or revoked.  Performed at [facility], [address] [place], [place] [address]     Labs: CBC: Recent Labs  Lab 10/20/24 1821 10/21/24 0449 10/22/24 0910 10/23/24 0733 10/24/24 0448 10/25/24 0525  WBC 19.7* 12.2* 11.6* 11.2* 13.6* 10.9*  NEUTROABS 17.8*  --  10.0* 10.2* 12.7* 9.9*  HGB 13.8 12.4* 12.6* 11.8* 11.6* 12.0*  HCT 45.0 38.9* 39.1 36.7* 35.0* 36.3*  MCV 100.2* 97.7 95.6 95.6 93.8 93.6  PLT 140* 78* 67* 56* 58* 68*   Basic Metabolic Panel: Recent Labs  Lab 10/21/24 0449 10/21/24 2031 10/22/24 0910 10/23/24 0733 10/24/24 0448 10/25/24 0525  NA 160* 158* 155* 150* 147* 141  K 4.3 4.4 3.9 3.3* 3.2* 3.7  CL 122* 119* 120* 116* 111 108  CO2 22 27 24 25 24 22   GLUCOSE 111* 82 141* 214* 185* 134*  BUN 88* 74* 74* 67* 60* 53*  CREATININE 2.68* 1.99* 1.93* 1.67* 1.63* 1.43*  CALCIUM  9.0 9.4 9.0 8.7* 8.8* 8.7*  MG 3.3*  --  2.9* 2.6* 2.8* 2.5*  PHOS 4.9*  --  3.7 3.7 3.4 2.6   Liver Function Tests: Recent Labs  Lab 10/21/24 0449 10/22/24 0910 10/23/24 0733 10/24/24 0448 10/25/24 0525  AST 112* 110* 59* 44* 34  ALT 30 38 34 30 28  ALKPHOS 72 71 66 95 74  BILITOT 0.9 0.8 0.7 0.8 0.9  PROT 7.1 6.8 6.2* 6.5 6.6  ALBUMIN 3.2* 3.0* 2.8* 2.9* 2.9*   CBG: No results for input(s): GLUCAP in the last 168 hours.  Discharge time spent: less than 30 minutes.  Signed: Delon Herald, MD [facility] 10/26/2024 "

## 2024-10-26 NOTE — Plan of Care (Signed)
" °  Problem: Education: Goal: Knowledge of risk factors and measures for prevention of condition will improve Outcome: Progressing   Problem: Coping: Goal: Psychosocial and spiritual needs will be supported Outcome: Progressing   Problem: Respiratory: Goal: Will maintain a patent airway Outcome: Progressing Goal: Complications related to the disease process, condition or treatment will be avoided or minimized Outcome: Progressing   Problem: Education: Goal: Knowledge of General Education information will improve Description: Including pain rating scale, medication(s)/side effects and non-pharmacologic comfort measures Outcome: Progressing   Problem: Health Behavior/Discharge Planning: Goal: Ability to manage health-related needs will improve Outcome: Progressing   Problem: Clinical Measurements: Goal: Ability to maintain clinical measurements within normal limits will improve Outcome: Progressing Goal: Will remain free from infection Outcome: Progressing Goal: Diagnostic test results will improve Outcome: Progressing Goal: Respiratory complications will improve Outcome: Progressing Goal: Cardiovascular complication will be avoided Outcome: Progressing   Problem: Activity: Goal: Risk for activity intolerance will decrease Outcome: Progressing   Problem: Nutrition: Goal: Adequate nutrition will be maintained Outcome: Progressing   Problem: Coping: Goal: Level of anxiety will decrease Outcome: Progressing   Problem: Elimination: Goal: Will not experience complications related to bowel motility Outcome: Progressing Goal: Will not experience complications related to urinary retention Outcome: Progressing   Problem: Pain Managment: Goal: General experience of comfort will improve and/or be controlled Outcome: Progressing   Problem: Safety: Goal: Ability to remain free from injury will improve Outcome: Progressing   Problem: Skin Integrity: Goal: Risk for impaired  skin integrity will decrease Outcome: Progressing   Problem: Education: Goal: Knowledge of the prescribed therapeutic regimen will improve Outcome: Progressing   Problem: Coping: Goal: Ability to identify and develop effective coping behavior will improve Outcome: Progressing   Problem: Clinical Measurements: Goal: Quality of life will improve Outcome: Progressing   Problem: Respiratory: Goal: Verbalizations of increased ease of respirations will increase Outcome: Progressing   Problem: Role Relationship: Goal: Family's ability to cope with current situation will improve Outcome: Progressing Goal: Ability to verbalize concerns, feelings, and thoughts to partner or family member will improve Outcome: Progressing   Problem: Pain Management: Goal: Satisfaction with pain management regimen will improve Outcome: Progressing   "

## 2024-11-26 DEATH — deceased
